# Patient Record
Sex: Female | Born: 1985 | Race: White | Hispanic: No | Marital: Single | Smoking: Current every day smoker
Health system: Southern US, Community
[De-identification: ages and names within clinical notes are randomized; demographics above are authoritative.]

## PROBLEM LIST (undated history)

## (undated) ENCOUNTER — Inpatient Hospital Stay (HOSPITAL_COMMUNITY): Payer: Self-pay

## (undated) DIAGNOSIS — K089 Disorder of teeth and supporting structures, unspecified: Secondary | ICD-10-CM

## (undated) DIAGNOSIS — R45851 Suicidal ideations: Secondary | ICD-10-CM

## (undated) DIAGNOSIS — F319 Bipolar disorder, unspecified: Secondary | ICD-10-CM

## (undated) HISTORY — PX: MANDIBLE FRACTURE SURGERY: SHX706

---

## 2005-10-21 ENCOUNTER — Inpatient Hospital Stay (HOSPITAL_COMMUNITY): Admission: AD | Admit: 2005-10-21 | Discharge: 2005-10-21 | Payer: Self-pay | Admitting: Family Medicine

## 2011-01-19 ENCOUNTER — Encounter: Payer: Self-pay | Admitting: Emergency Medicine

## 2011-01-19 ENCOUNTER — Emergency Department (HOSPITAL_COMMUNITY)
Admission: EM | Admit: 2011-01-19 | Discharge: 2011-01-19 | Disposition: A | Discharge status: Home / Self Care | Payer: Self-pay | Attending: Emergency Medicine | Admitting: Emergency Medicine

## 2011-01-19 DIAGNOSIS — K2970 Gastritis, unspecified, without bleeding: Secondary | ICD-10-CM | POA: Insufficient documentation

## 2011-01-19 DIAGNOSIS — K2990 Gastroduodenitis, unspecified, without bleeding: Secondary | ICD-10-CM | POA: Insufficient documentation

## 2011-01-19 DIAGNOSIS — Z79899 Other long term (current) drug therapy: Secondary | ICD-10-CM | POA: Insufficient documentation

## 2011-01-19 DIAGNOSIS — F3130 Bipolar disorder, current episode depressed, mild or moderate severity, unspecified: Secondary | ICD-10-CM | POA: Insufficient documentation

## 2011-01-19 DIAGNOSIS — K920 Hematemesis: Secondary | ICD-10-CM | POA: Insufficient documentation

## 2011-01-19 HISTORY — DX: Suicidal ideations: R45.851

## 2011-01-19 HISTORY — DX: Bipolar disorder, unspecified: F31.9

## 2011-01-19 LAB — URINALYSIS, ROUTINE W REFLEX MICROSCOPIC
Hgb urine dipstick: NEGATIVE
Nitrite: NEGATIVE
Protein, ur: NEGATIVE mg/dL
Specific Gravity, Urine: 1.039 — ABNORMAL HIGH (ref 1.005–1.030)
Urobilinogen, UA: 1 mg/dL (ref 0.0–1.0)

## 2011-01-19 LAB — PREGNANCY, URINE: Preg Test, Ur: NEGATIVE

## 2011-01-19 LAB — CBC
MCH: 32.5 pg (ref 26.0–34.0)
MCHC: 35.6 g/dL (ref 30.0–36.0)
Platelets: 246 10*3/uL (ref 150–400)
RDW: 12.8 % (ref 11.5–15.5)

## 2011-01-19 LAB — BASIC METABOLIC PANEL
Calcium: 8.2 mg/dL — ABNORMAL LOW (ref 8.4–10.5)
GFR calc Af Amer: >90 mL/min (ref >90)
GFR calc non Af Amer: >90 mL/min (ref >90)
Potassium: 3.6 mEq/L (ref 3.5–5.1)
Sodium: 136 mEq/L (ref 135–145)

## 2011-01-19 MED ORDER — ONDANSETRON HCL 4 MG/2ML IJ SOLN
4.0000 mg | Freq: Once | INTRAMUSCULAR | Status: AC
  Administered 2011-01-19: 4 mg via INTRAVENOUS
  Filled 2011-01-19: qty 2

## 2011-01-19 MED ORDER — SODIUM CHLORIDE 0.9 % IV BOLUS (SEPSIS)
1000.0000 mL | Freq: Once | INTRAVENOUS | Status: AC
  Administered 2011-01-19: 1000 mL via INTRAVENOUS

## 2011-01-19 MED ORDER — PROMETHAZINE HCL 25 MG PO TABS: 25.0000 mg | ORAL_TABLET | Freq: Four times a day (QID) | ORAL | Status: DC | PRN

## 2011-01-19 MED ORDER — PANTOPRAZOLE SODIUM 40 MG IV SOLR
40.0000 mg | Freq: Once | INTRAVENOUS | Status: AC
  Administered 2011-01-19: 40 mg via INTRAVENOUS
  Filled 2011-01-19: qty 40

## 2011-01-19 MED ORDER — SODIUM CHLORIDE 0.9 % IV SOLN
INTRAVENOUS | Status: DC
  Administered 2011-01-19: 22:00:00 via INTRAVENOUS

## 2011-01-19 MED ORDER — SODIUM CHLORIDE 0.9 % IV BOLUS (SEPSIS): 1000.0000 mL | Freq: Once | INTRAVENOUS | Status: DC

## 2011-01-19 MED ORDER — FAMOTIDINE 20 MG PO TABS: 20.0000 mg | ORAL_TABLET | Freq: Two times a day (BID) | ORAL | Status: DC

## 2011-01-19 NOTE — ED Notes (Signed)
Family at bedside. 

## 2011-01-19 NOTE — ED Notes (Signed)
Patient is resting comfortably. 

## 2011-01-19 NOTE — ED Provider Notes (Signed)
History     CSN: [id number] Arrival date & time: 01/19/2011  7:41 PM   First MD Initiated Contact with Patient 01/19/11 2112      Chief Complaint  Patient presents with  . Emesis    (Consider location/radiation/quality/duration/timing/severity/associated sxs/prior treatment) Patient is a 25 y.o. female presenting with vomiting. The history is provided by the patient.  Emesis  This is a recurrent problem. The current episode started more than 2 days ago (Started on Thursday 3 days ago.). The problem occurs 5 to 10 times per day. The problem has not changed since onset.The emesis has an appearance of stomach contents (Some specks of blood). There has been no fever. Pertinent negatives include no abdominal pain, no chills, no cough, no diarrhea, no fever, no headaches, no myalgias and no URI.    Patient with onset of vomiting 3 days ago no diarrhea some dark red specks of blood in the vomitus but no pool of blood, no significant abdominal pain, no diarrhea.  Past Medical History  Diagnosis Date  . Bipolar 1 disorder   . Manic depression   . Suicidal ideation     Past Surgical History  Procedure Date  . Mandible fracture surgery     No family history on file.  History  Substance Use Topics  . Smoking status: Current Everyday Smoker  . Smokeless tobacco: Not on file  . Alcohol Use: No    OB History    Grav Para Term Preterm Abortions TAB SAB Ect Mult Living                  Review of Systems  Constitutional: Negative for fever and chills.  HENT: Negative for congestion and neck pain.   Eyes: Negative for redness and visual disturbance.  Respiratory: Negative for cough and shortness of breath.   Cardiovascular: Negative for chest pain.  Gastrointestinal: Positive for nausea and vomiting. Negative for abdominal pain and diarrhea.  Genitourinary: Negative for dysuria and hematuria.  Musculoskeletal: Negative for myalgias and back pain.  Neurological: Negative for  syncope and headaches.  Hematological: Does not bruise/bleed easily.    Allergies  Codeine  Home Medications   Current Outpatient Rx  Name Route Sig Dispense Refill  . ACETAMINOPHEN 500 MG PO TABS Oral Take 1,000 mg by mouth every 6 (six) hours as needed. For pain     . BISMUTH SUBSALICYLATE 262 MG/15ML PO SUSP Oral Take 30 mLs by mouth every 6 (six) hours as needed. For upset stomach     . CITALOPRAM HYDROBROMIDE 20 MG PO TABS Oral Take 20 mg by mouth daily.      Marland Kitchen RISPERIDONE 2 MG PO TABS Oral Take 2 mg by mouth at bedtime.      . TRAZODONE HCL 150 MG PO TABS Oral Take 150 mg by mouth at bedtime.      Marland Kitchen FAMOTIDINE 20 MG PO TABS Oral Take 1 tablet (20 mg total) by mouth 2 (two) times daily. 30 tablet 0  . PROMETHAZINE HCL 25 MG PO TABS Oral Take 1 tablet (25 mg total) by mouth every 6 (six) hours as needed for nausea. 20 tablet 0    BP 103/51  Pulse 51  Temp(Src) 98 F (36.7 C) (Oral)  Resp 18  SpO2 100%  Physical Exam  Nursing note and vitals reviewed. Constitutional: She is oriented to person, place, and time. She appears well-developed and well-nourished.  HENT:  Head: Normocephalic and atraumatic.  Mouth/Throat: Oropharynx is clear and moist.  Mucous membranes slightly dry.  Eyes: Conjunctivae and EOM are normal. Pupils are equal, round, and reactive to light.  Neck: Normal range of motion. Neck supple.  Cardiovascular: Normal rate, regular rhythm, normal heart sounds and intact distal pulses.   No murmur heard. Pulmonary/Chest: Effort normal and breath sounds normal. She exhibits no tenderness.  Abdominal: Soft. Bowel sounds are normal. There is no tenderness.  Musculoskeletal: Normal range of motion. She exhibits no edema.  Lymphadenopathy:    She has no cervical adenopathy.  Neurological: She is alert and oriented to person, place, and time. No cranial nerve deficit. She exhibits normal muscle tone. Coordination normal.  Skin: Skin is warm and dry. No rash  noted. She is not diaphoretic. No erythema.    ED Course  Procedures (including critical care time)  Labs Reviewed  BASIC METABOLIC PANEL - Abnormal; Notable for the following:    Calcium 8.2 (*)    All other components within normal limits  URINALYSIS, ROUTINE W REFLEX MICROSCOPIC - Abnormal; Notable for the following:    Specific Gravity, Urine 1.039 (*)    Bilirubin Urine SMALL (*)    Ketones, ur >80 (*)    All other components within normal limits  CBC  PREGNANCY, URINE   Results for orders placed during the hospital encounter of 01/19/11  CBC      Component Value Range   WBC 7.5  4.0 - 10.5 (K/uL)   RBC 4.58  3.87 - 5.11 (MIL/uL)   Hemoglobin 14.9  12.0 - 15.0 (g/dL)   HCT 16.1  09.6 - 04.5 (%)   MCV 91.3  78.0 - 100.0 (fL)   MCH 32.5  26.0 - 34.0 (pg)   MCHC 35.6  30.0 - 36.0 (g/dL)   RDW 40.9  81.1 - 91.4 (%)   Platelets 246  150 - 400 (K/uL)  BASIC METABOLIC PANEL      Component Value Range   Sodium 136  135 - 145 (mEq/L)   Potassium 3.6  3.5 - 5.1 (mEq/L)   Chloride 101  96 - 112 (mEq/L)   CO2 20  19 - 32 (mEq/L)   Glucose, Bld 71  70 - 99 (mg/dL)   BUN 16  6 - 23 (mg/dL)   Creatinine, Ser 7.82  0.50 - 1.10 (mg/dL)   Calcium 8.2 (*) 8.4 - 10.5 (mg/dL)   GFR calc non Af Amer >90  >90 (mL/min)   GFR calc Af Amer >90  >90 (mL/min)  URINALYSIS, ROUTINE W REFLEX MICROSCOPIC      Component Value Range   Color, Urine YELLOW  YELLOW    APPearance CLEAR  CLEAR    Specific Gravity, Urine 1.039 (*) 1.005 - 1.030    pH 5.5  5.0 - 8.0    Glucose, UA NEGATIVE  NEGATIVE (mg/dL)   Hgb urine dipstick NEGATIVE  NEGATIVE    Bilirubin Urine SMALL (*) NEGATIVE    Ketones, ur >80 (*) NEGATIVE (mg/dL)   Protein, ur NEGATIVE  NEGATIVE (mg/dL)   Urobilinogen, UA 1.0  0.0 - 1.0 (mg/dL)   Nitrite NEGATIVE  NEGATIVE    Leukocytes, UA NEGATIVE  NEGATIVE   PREGNANCY, URINE      Component Value Range   Preg Test, Ur NEGATIVE       1. Gastritis   2. Hematemesis       MDM     Patient with persistent vomiting for several days some specks of blood in the vomit never any pool of blood. Labs are  normal in the emergency department, pregnancy test negative, improved with 2 L of fluid IV. Feels much better. Suspect gastritis may be viral no significant upper GI bleed. Will treat with Pepcid and Phenergan she will return to the emergency department for vomiting pool of blood or worse symptoms.        Shelda Jakes, MD 01/19/11 [phone]

## 2011-01-19 NOTE — ED Notes (Signed)
C/o nausea, vomiting, fever, and dizziness since Thursday.  States she is now vomiting dark colored blood.

## 2011-01-22 ENCOUNTER — Encounter (HOSPITAL_COMMUNITY): Payer: Self-pay | Admitting: Emergency Medicine

## 2011-01-22 ENCOUNTER — Emergency Department (HOSPITAL_COMMUNITY)
Admission: EM | Admit: 2011-01-22 | Discharge: 2011-01-22 | Discharge status: Against Medical Advice | Payer: Self-pay | Attending: Emergency Medicine | Admitting: Emergency Medicine

## 2011-01-22 DIAGNOSIS — L298 Other pruritus: Secondary | ICD-10-CM | POA: Insufficient documentation

## 2011-01-22 DIAGNOSIS — R21 Rash and other nonspecific skin eruption: Secondary | ICD-10-CM | POA: Insufficient documentation

## 2011-01-22 DIAGNOSIS — L2989 Other pruritus: Secondary | ICD-10-CM | POA: Insufficient documentation

## 2011-01-22 NOTE — ED Notes (Signed)
Pt did not answer x 2 

## 2011-01-22 NOTE — ED Notes (Signed)
Pt c/o rash all over body with itching onset 2 days ago.

## 2011-02-18 NOTE — L&D Delivery Note (Signed)
Delivery Note At 6:05 AM a viable and healthy female was delivered via Vaginal, Spontaneous Delivery (Presentation: Right Occiput Posterior).  APGAR: 8, 9; weight .   Placenta status: Intact, Spontaneous.  Cord: 3 vessels with the following complications: None.  Cord pH: n/a  Anesthesia: None  Episiotomy: None Lacerations: None Suture Repair: n/a Est. Blood Loss (mL): 250  Mom to postpartum.  Baby to nursery-stable.   [name] 11/12/2011, 6:18 AM

## 2011-02-18 NOTE — L&D Delivery Note (Signed)
I have seen and examined this patient and I agree with the above. Cam Hai 11:35 PM 11/12/2011

## 2011-09-27 ENCOUNTER — Inpatient Hospital Stay (HOSPITAL_COMMUNITY)
Admission: AD | Admit: 2011-09-27 | Discharge: 2011-09-28 | Disposition: A | Discharge status: Home / Self Care | Payer: Medicaid Other | Source: Ambulatory Visit | Attending: Obstetrics & Gynecology | Admitting: Obstetrics & Gynecology

## 2011-09-27 ENCOUNTER — Inpatient Hospital Stay (HOSPITAL_COMMUNITY): Payer: Medicaid Other

## 2011-09-27 ENCOUNTER — Encounter (HOSPITAL_COMMUNITY): Payer: Self-pay | Admitting: *Deleted

## 2011-09-27 DIAGNOSIS — A499 Bacterial infection, unspecified: Secondary | ICD-10-CM | POA: Insufficient documentation

## 2011-09-27 DIAGNOSIS — N390 Urinary tract infection, site not specified: Secondary | ICD-10-CM | POA: Insufficient documentation

## 2011-09-27 DIAGNOSIS — O2390 Unspecified genitourinary tract infection in pregnancy, unspecified trimester: Secondary | ICD-10-CM | POA: Insufficient documentation

## 2011-09-27 DIAGNOSIS — O0930 Supervision of pregnancy with insufficient antenatal care, unspecified trimester: Secondary | ICD-10-CM

## 2011-09-27 DIAGNOSIS — O2340 Unspecified infection of urinary tract in pregnancy, unspecified trimester: Secondary | ICD-10-CM

## 2011-09-27 DIAGNOSIS — R079 Chest pain, unspecified: Secondary | ICD-10-CM | POA: Insufficient documentation

## 2011-09-27 DIAGNOSIS — N760 Acute vaginitis: Secondary | ICD-10-CM | POA: Insufficient documentation

## 2011-09-27 DIAGNOSIS — R109 Unspecified abdominal pain: Secondary | ICD-10-CM | POA: Insufficient documentation

## 2011-09-27 DIAGNOSIS — B9689 Other specified bacterial agents as the cause of diseases classified elsewhere: Secondary | ICD-10-CM | POA: Insufficient documentation

## 2011-09-27 HISTORY — DX: Disorder of teeth and supporting structures, unspecified: K08.9

## 2011-09-27 LAB — WET PREP, GENITAL: Trich, Wet Prep: NONE SEEN

## 2011-09-27 LAB — URINALYSIS, ROUTINE W REFLEX MICROSCOPIC
Bilirubin Urine: NEGATIVE
Ketones, ur: 15 mg/dL — AB
Nitrite: POSITIVE — AB
Protein, ur: NEGATIVE mg/dL
pH: 6.5 (ref 5.0–8.0)

## 2011-09-27 LAB — RAPID URINE DRUG SCREEN, HOSP PERFORMED
Barbiturates: NOT DETECTED
Benzodiazepines: NOT DETECTED
Cocaine: NOT DETECTED
Opiates: NOT DETECTED
Tetrahydrocannabinol: NOT DETECTED

## 2011-09-27 LAB — CBC
HCT: 33.2 % — ABNORMAL LOW (ref 36.0–46.0)
Hemoglobin: 11.3 g/dL — ABNORMAL LOW (ref 12.0–15.0)
WBC: 12.4 10*3/uL — ABNORMAL HIGH (ref 4.0–10.5)

## 2011-09-27 LAB — DIFFERENTIAL
Lymphocytes Relative: 28 % (ref 12–46)
Monocytes Absolute: 1 10*3/uL (ref 0.1–1.0)
Monocytes Relative: 8 % (ref 3–12)
Neutro Abs: 7.9 10*3/uL — ABNORMAL HIGH (ref 1.7–7.7)

## 2011-09-27 LAB — URINE MICROSCOPIC-ADD ON

## 2011-09-27 MED ORDER — RANITIDINE HCL 150 MG PO TABS: 150.0000 mg | ORAL_TABLET | Freq: Two times a day (BID) | ORAL | Status: DC

## 2011-09-27 MED ORDER — METRONIDAZOLE 500 MG PO TABS: 500.0000 mg | ORAL_TABLET | Freq: Two times a day (BID) | ORAL | Status: AC

## 2011-09-27 MED ORDER — CEPHALEXIN 500 MG PO CAPS: 500.0000 mg | ORAL_CAPSULE | Freq: Two times a day (BID) | ORAL | Status: AC

## 2011-09-27 MED ORDER — GI COCKTAIL ~~LOC~~: 30.0000 mL | Freq: Once | ORAL | Status: DC

## 2011-09-27 MED ORDER — GI COCKTAIL ~~LOC~~
30.0000 mL | Freq: Once | ORAL | Status: AC
  Administered 2011-09-27: 30 mL via ORAL
  Filled 2011-09-27: qty 30

## 2011-09-27 NOTE — MAU Note (Signed)
Pt states, " I started having pain from my chest to my upper legs since Wed, and it has gotten worse today."
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
## 2011-09-27 NOTE — Discharge Instructions (Signed)
Bacterial Vaginosis Bacterial vaginosis (BV) is a vaginal infection where the normal balance of bacteria in the vagina is disrupted. The normal balance is then replaced by an overgrowth of certain bacteria. There are several different kinds of bacteria that can cause BV. BV is the most common vaginal infection in women of childbearing age. CAUSES   The cause of BV is not fully understood. BV develops when there is an increase or imbalance of harmful bacteria.   Some activities or behaviors can upset the normal balance of bacteria in the vagina and put women at increased risk including:   Having a new sex partner or multiple sex partners.   Douching.   Using an intrauterine device (IUD) for contraception.   It is not clear what role sexual activity plays in the development of BV. However, women that have never had sexual intercourse are rarely infected with BV.  Women do not get BV from toilet seats, bedding, swimming pools or from touching objects around them.  SYMPTOMS   Grey vaginal discharge.   A fish-like odor with discharge, especially after sexual intercourse.   Itching or burning of the vagina and vulva.   Burning or pain with urination.   Some women have no signs or symptoms at all.  DIAGNOSIS  Your caregiver must examine the vagina for signs of BV. Your caregiver will perform lab tests and look at the sample of vaginal fluid through a microscope. They will look for bacteria and abnormal cells (clue cells), a pH test higher than 4.5, and a positive amine test all associated with BV.  RISKS AND COMPLICATIONS   Pelvic inflammatory disease (PID).   Infections following gynecology surgery.   Developing HIV.   Developing herpes virus.  TREATMENT  Sometimes BV will clear up without treatment. However, all women with symptoms of BV should be treated to avoid complications, especially if gynecology surgery is planned. Female partners generally do not need to be treated. However,  BV may spread between female sex partners so treatment is helpful in preventing a recurrence of BV.   BV may be treated with antibiotics. The antibiotics come in either pill or vaginal cream forms. Either can be used with nonpregnant or pregnant women, but the recommended dosages differ. These antibiotics are not harmful to the baby.   BV can recur after treatment. If this happens, a second round of antibiotics will often be prescribed.   Treatment is important for pregnant women. If not treated, BV can cause a premature delivery, especially for a pregnant woman who had a premature birth in the past. All pregnant women who have symptoms of BV should be checked and treated.   For chronic reoccurrence of BV, treatment with a type of prescribed gel vaginally twice a week is helpful.  HOME CARE INSTRUCTIONS   Finish all medication as directed by your caregiver.   Do not have sex until treatment is completed.   Tell your sexual partner that you have a vaginal infection. They should see their caregiver and be treated if they have problems, such as a mild rash or itching.   Practice safe sex. Use condoms. Only have 1 sex partner.  PREVENTION  Basic prevention steps can help reduce the risk of upsetting the natural balance of bacteria in the vagina and developing BV:  Do not have sexual intercourse (be abstinent).   Do not douche.   Use all of the medicine prescribed for treatment of BV, even if the signs and symptoms go away.  Tell your sex partner if you have BV. That way, they can be treated, if needed, to prevent reoccurrence.  SEEK MEDICAL CARE IF:   Your symptoms are not improving after 3 days of treatment.   You have increased discharge, pain, or fever.  MAKE SURE YOU:   Understand these instructions.   Will watch your condition.   Will get help right away if you are not doing well or get worse.  FOR MORE INFORMATION  Division of STD Prevention (DSTDP), Centers for Disease  Control and Prevention: SolutionApps.co.za American Social Health Association (ASHA): www.ashastd.org  Document Released: 02/03/2005 Document Revised: 01/23/2011 Document Reviewed: 07/27/2008 [facility] Patient Information 2012 [place] [place], [place].Urinary Tract Infection in Pregnancy A urinary tract infection (UTI) is a bacterial infection of the urinary tract. Infection of the urinary tract can include the ureters, kidneys (pyelonephritis), bladder (cystitis), and urethra (urethritis). All pregnant women should be screened for bacteria in the urinary tract. Identifying and treating a UTI will decrease the risk of preterm labor and developing more serious infections in both the mother and baby. CAUSES Bacteria germs cause almost all UTIs. There are many factors that can increase your chances of getting a UTI during pregnancy. These include:  Having a short urethra.   Poor toilet and hygiene habits.   Sexual intercourse.   Blockage of urine along the urinary tract.   Problems with the pelvic muscles or nerves.   Diabetes.   Obesity.   Bladder problems after having several children.   Previous history of UTI.  SYMPTOMS   Pain, burning, or a stinging feeling when urinating.   Suddenly feeling the need to urinate right away (urgency).   Loss of bladder control (urinary incontinence).   Frequent urination, more than is common with pregnancy.   Lower abdominal or back discomfort.   Bad smelling urine.   Cloudy urine.   Blood in the urine (hematuria).   Fever.  When the kidneys are infected, the symptoms may be:  Back pain.   Flank pain on the right side more so than the left.   Fever.   Chills.   Nausea.   Vomiting.  DIAGNOSIS   Urine tests.   Additional tests and procedures may include:   Ultrasound of the kidneys, ureters, bladder, and urethra.   Looking in the bladder with a lighted tube (cystoscopy).   Certain X-ray studies only when absolutely necessary.   Finding out the results of your test Ask when your test results will be ready. Make sure you get your test results. TREATMENT  Antibiotic medicine by mouth.   Antibiotics given through the vein (intravenously), if needed.  HOME CARE INSTRUCTIONS   Take your antibiotics as directed. Finish them even if you start to feel better. Only take medicine as directed by your caregiver.   Drink enough fluids to keep your urine clear or pale yellow.   Do not have sexual intercourse until the infection is gone and your caregiver says it is okay.   Make sure you are tested for UTIs throughout your pregnancy if you get one. These infections often come back.  Preventing a UTI in the future:  Practice good toilet habits. Always wipe from front to back. Use the tissue only once.   Do not hold your urine. Empty your bladder as soon as possible when the urge comes.   Do not douche or use deodorant sprays.   Wash with soap and warm water around the genital area and the anus.   Empty  your bladder before and after sexual intercourse.   Wear underwear with a cotton crotch.   Avoid caffeine and carbonated drinks. They can irritate the bladder.   Drink cranberry juice or take cranberry pills. This may decrease the risk of getting a UTI.   Do not drink alcohol.   Keep all your appointments and tests as scheduled.  SEEK MEDICAL CARE IF:   Your symptoms get worse.   You are still having fevers 2 or more days after treatment begins.   You develop a rash.   You feel that you are having problems with medicines prescribed.   You develop abnormal vaginal discharge.  SEEK IMMEDIATE MEDICAL CARE IF:   You develop back or flank pain.   You develop chills.   You have blood in your urine.   You develop nausea and vomiting.   You develop contractions of your uterus.   You have a gush of fluid from the vagina.  MAKE SURE YOU:   Understand these instructions.   Will watch your condition.    Will get help right away if you are not doing well or get worse.  Document Released: 05/31/2010 Document Revised: 01/23/2011 Document Reviewed: 05/31/2010 [facility] Patient Information 2012 [place], [place].

## 2011-09-27 NOTE — Progress Notes (Signed)
To us via wc.

## 2011-09-27 NOTE — MAU Provider Note (Signed)
History      CSN: [id number]  Arrival date and time: 09/27/11 2012   None     Chief Complaint  Patient presents with  . Abdominal Pain  . Chest Pain  . Vaginal Discharge   HPI Pt is a 26 y.o. [id number] at 33.2 wks by LMP presenting with pain in lower abdomen and chest. Abdominal pain extends from lower quadrants to mid-thigh and is stabbing in nature, does not think they are contractions. Also has vaginal discharge, mostly mucous but some odor. Pain in chest is stabbing, lower sternum/epigastric area. All symptoms started approximately 4 days ago. Also states she has had fever. Taking tylenol. Pain better with sitting, walking, changing position. Nothing really makes it worse. No dysuria or urgency.   Pt had initial prenatal visit in March at [facility]. and told EDD 11/13/11 based on LMP.  Moved to [place] and had a few visits there including an ultrasound which she states was normal. EDD by this sono was 11/24/11 (done at about 22 wks.).  States LMP was 01/27/11 and that she has regular menses.  No loss of fluid. No VB. Good FM, no discreet contractions.  OB History    Grav Para Term Preterm Abortions TAB SAB Ect Mult Living   6 2 2  4  4   2       Past Medical History  Diagnosis Date  . Bipolar 1 disorder   . Manic depression   . Suicidal ideation   . Poor dentition     Past Surgical History  Procedure Date  . Mandible fracture surgery     Family History  Problem Relation Age of Onset  . Other Neg Hx     History  Substance Use Topics  . Smoking status: Current Everyday Smoker  . Smokeless tobacco: Not on file  . Alcohol Use: No    Allergies:  Allergies  Allergen Reactions  . Codeine Nausea And Vomiting    Prescriptions prior to admission  Medication Sig Dispense Refill  . acetaminophen (TYLENOL) 500 MG tablet Take 500 mg by mouth every 6 (six) hours as needed. For pain      . Prenatal Vit-Fe Fumarate-FA (PRENATAL MULTIVITAMIN) TABS Take 1  tablet by mouth daily.        Review of Systems  Constitutional: Positive for fever. Negative for chills.  Eyes: Negative for blurred vision and double vision.  Respiratory: Negative for cough, sputum production and shortness of breath.   Cardiovascular: Positive for chest pain (see HPI).  Gastrointestinal: Positive for nausea, vomiting (Occasional vomiting, sometimes with a little blood) and constipation. Negative for heartburn and diarrhea.  Genitourinary: Negative for dysuria and urgency.  Neurological: Negative for dizziness and headaches.  Psychiatric/Behavioral:       Denies current symptoms of depression/suicidality/mania   Physical Exam   Blood pressure 124/61, pulse 79, temperature 97.9 F (36.6 C), temperature source Oral, resp. rate 20, height 5\' 1"  (1.549 m), weight 62.256 kg (137 lb 4 oz), unknown if currently breastfeeding.  Physical Exam  Constitutional: She is oriented to person, place, and time. She appears well-developed and well-nourished. No distress.  HENT:  Head: Normocephalic.       Poor dentition  Neck: Normal range of motion.  Cardiovascular: Normal rate, regular rhythm and normal heart sounds.   Respiratory: Effort normal and breath sounds normal. No respiratory distress. She exhibits tenderness (Mild tenderness lower sternum).  GI: Soft. Bowel sounds are normal. She exhibits no distension. There is  tenderness (Left and right lower quadrants and suprapubic. Mild epigastric tenderness).  Genitourinary: Vaginal discharge (mucous and milky green/gray discharge) found.  Musculoskeletal: She exhibits no edema.  Neurological: She is alert and oriented to person, place, and time.  Skin: Skin is warm and dry.  Psychiatric: She has a normal mood and affect.   Dilation: Closed/thick/high Exam by:: Dr Thad Ranger  [facility]:  130, moderate variability, +accels, no decels. Cat I. Reactive NST Toco:  Few irregular contractions after cervical exam.  MAU Course   Procedures  MDM  Assessment and Plan  25 y.o. [id number] at [redacted]w[redacted]d by LMP with 1.  UTI - cephalexin 2. BV - metronidazole.  3.  Lower bdominal pain - Cervix closed, not contracting regularly. Likely due to 1 & 2. 4. Insufficient prenatal care. Prenatal labs and ultrasound done today. Establish care at [facility]. 5.  Chest pain - musculockeletal (reproducible), possibly related to reflux. GI cocktail given in MAU. Somewhat improved. Will send home on ranitidine.     Napoleon Form 09/27/2011, 9:14 PM

## 2011-09-27 NOTE — MAU Note (Signed)
OK to leave pt off efm when returns from u/s per W. Muhammed CNM

## 2011-09-28 DIAGNOSIS — N390 Urinary tract infection, site not specified: Secondary | ICD-10-CM

## 2011-09-28 DIAGNOSIS — O2390 Unspecified genitourinary tract infection in pregnancy, unspecified trimester: Secondary | ICD-10-CM

## 2011-09-28 LAB — RPR: RPR Ser Ql: NONREACTIVE

## 2011-09-28 MED ORDER — ONDANSETRON HCL 4 MG PO TABS: 4.0000 mg | ORAL_TABLET | Freq: Three times a day (TID) | ORAL | Status: AC | PRN

## 2011-09-28 NOTE — Progress Notes (Signed)
Written and verbal d/c instructions given and understanding voiced. 

## 2011-09-29 ENCOUNTER — Encounter: Payer: Self-pay | Admitting: Family

## 2011-09-29 ENCOUNTER — Encounter: Payer: Self-pay | Admitting: *Deleted

## 2011-09-29 LAB — GC/CHLAMYDIA PROBE AMP, GENITAL: Chlamydia, DNA Probe: NEGATIVE

## 2011-10-08 ENCOUNTER — Encounter: Payer: Self-pay | Admitting: Physician Assistant

## 2011-10-27 ENCOUNTER — Encounter: Payer: Self-pay | Admitting: Obstetrics & Gynecology

## 2011-10-30 ENCOUNTER — Ambulatory Visit (INDEPENDENT_AMBULATORY_CARE_PROVIDER_SITE_OTHER): Payer: Self-pay | Admitting: Obstetrics & Gynecology

## 2011-10-30 ENCOUNTER — Encounter: Payer: Self-pay | Admitting: Obstetrics & Gynecology

## 2011-10-30 VITALS — BP 127/82 | Temp 97.0°F | Wt 132.1 lb

## 2011-10-30 DIAGNOSIS — Z23 Encounter for immunization: Secondary | ICD-10-CM

## 2011-10-30 DIAGNOSIS — O2390 Unspecified genitourinary tract infection in pregnancy, unspecified trimester: Secondary | ICD-10-CM

## 2011-10-30 DIAGNOSIS — N390 Urinary tract infection, site not specified: Secondary | ICD-10-CM

## 2011-10-30 DIAGNOSIS — Z3490 Encounter for supervision of normal pregnancy, unspecified, unspecified trimester: Secondary | ICD-10-CM

## 2011-10-30 DIAGNOSIS — O0930 Supervision of pregnancy with insufficient antenatal care, unspecified trimester: Secondary | ICD-10-CM

## 2011-10-30 DIAGNOSIS — O2340 Unspecified infection of urinary tract in pregnancy, unspecified trimester: Secondary | ICD-10-CM | POA: Insufficient documentation

## 2011-10-30 LAB — POCT URINALYSIS DIP (DEVICE)
Ketones, ur: NEGATIVE mg/dL
Protein, ur: 30 mg/dL — AB
Specific Gravity, Urine: 1.015 (ref 1.005–1.030)
Urobilinogen, UA: 0.2 mg/dL (ref 0.0–1.0)
pH: 7 (ref 5.0–8.0)

## 2011-10-30 MED ORDER — TETANUS-DIPHTH-ACELL PERTUSSIS 5-2.5-18.5 LF-MCG/0.5 IM SUSP: 0.5000 mL | Freq: Once | INTRAMUSCULAR | Status: DC

## 2011-10-30 MED ORDER — CEPHALEXIN 500 MG PO CAPS: 500.0000 mg | ORAL_CAPSULE | Freq: Three times a day (TID) | ORAL | Status: AC

## 2011-10-30 NOTE — Progress Notes (Signed)
   Subjective:    Lisa Jones is a [id number] [redacted]w[redacted]d being seen today for her first obstetrical visit.  Her obstetrical history is significant for late prenatal care. Patient does intend to breast feed. Pregnancy history fully reviewed.  Patient reports no complaints.  Filed Vitals:   10/30/11 0930  BP: 127/82  Temp: 97 F (36.1 C)  Weight: 132 lb 1.6 oz (59.92 kg)    HISTORY: OB History    Grav Para Term Preterm Abortions TAB SAB Ect Mult Living   7 2 2  4  4   2      # Outc Date GA Lbr Len/2nd Wgt Sex Del Anes PTL Lv   1 SAB 2004 [redacted]w[redacted]d          2 SAB 2004 [redacted]w[redacted]d          3 SAB 2005 [redacted]w[redacted]d          4 SAB 2007 [redacted]w[redacted]d          Comments: had D&C   5 TRM 4/08 [redacted]w[redacted]d  5lb15oz(2.693kg) F SVD   Yes   6 TRM 9/12 [redacted]w[redacted]d  5lb8oz(2.495kg) F SVD   Yes   7 CUR              Past Medical History  Diagnosis Date  . Suicidal ideation   . Poor dentition   . Bipolar 1 disorder   . Manic depression    Past Surgical History  Procedure Date  . Mandible fracture surgery    Family History  Problem Relation Age of Onset  . Other Neg Hx   . Heart disease Father      Exam    Uterus:     Pelvic Exam:    Perineum: No Hemorrhoids   Vulva: normal   Vagina:  thin grey discharge   pH:    Cervix: no lesions   Adnexa: no mass, fullness, tenderness   Bony Pelvis: gynecoid  System: Breast:     Skin: normal coloration and turgor, no rashes    Neurologic: oriented, grossly non-focal, normal mood   Extremities: normal strength, tone, and muscle mass   HEENT    Mouth/Teeth mucous membranes moist, pharynx normal without lesions, dental hygiene poor and multiple caries   Neck supple and no masses   Cardiovascular:    Respiratory:  appears well, vitals normal, no respiratory distress, acyanotic, normal RR   Abdomen: gravid 35 cm FH   Urinary: urethral meatus normal      Assessment:    Pregnancy: [id number] Patient Active Problem List  Diagnosis  . Supervision of normal pregnancy  . UTI  (urinary tract infection) during pregnancy        Plan:     Initial labs drawn. Prenatal vitamins. Problem list reviewed and updated.   Ultrasound discussed; fetal survey: results reviewed.  Follow up in 1 weeks. 50% of 30 min visit spent on counseling and coordination of care.  Social work Cephalexin rx   Lisa Jones 10/30/2011

## 2011-10-30 NOTE — Addendum Note (Signed)
Addended byRaynald Blend L on: 10/30/2011 11:05 AM   Modules accepted: Orders

## 2011-10-30 NOTE — Progress Notes (Signed)
P=68 Patient needs prenatal vitamin prescription of one that is more easily tolerated- states the over the counter ones make her sick. C/o trace edema in feet. Has had alittle prenatal care in [place]. C/o pelvic pain. C/o contractions past 3 days about 6-7 times a day. Per depression screen states depressed sometimes, her daughters are in [place]  With family. Denies thoughts of hurting self. Happy baby is boy.

## 2011-10-30 NOTE — Addendum Note (Signed)
Addended by: Franchot Mimes on: 10/30/2011 11:00 AM   Modules accepted: Orders

## 2011-10-30 NOTE — Patient Instructions (Signed)
Normal Labor and Delivery Your caregiver must first be sure you are in labor. Signs of labor include:  You may pass what is called "the mucus plug" before labor begins. This is a small amount of blood stained mucus.   Regular uterine contractions.   The time between contractions get closer together.   The discomfort and pain gradually gets more intense.   Pains are mostly located in the back.   Pains get worse when walking.   The cervix (the opening of the uterus becomes thinner (begins to efface) and opens up (dilates).  Once you are in labor and admitted into the hospital or care center, your caregiver will do the following:  A complete physical examination.   Check your vital signs (blood pressure, pulse, temperature and the fetal heart rate).   Do a vaginal examination (using a sterile glove and lubricant) to determine:   The position (presentation) of the baby (head [vertex] or buttock first).   The level (station) of the baby's head in the birth canal.   The effacement and dilatation of the cervix.   You may have your pubic hair shaved and be given an enema depending on your caregiver and the circumstance.   An electronic monitor is usually placed on your abdomen. The monitor follows the length and intensity of the contractions, as well as the baby's heart rate.   Usually, your caregiver will insert an IV in your arm with a bottle of sugar water. This is done as a precaution so that medications can be given to you quickly during labor or delivery.  NORMAL LABOR AND DELIVERY IS DIVIDED UP INTO 3 STAGES: First Stage This is when regular contractions begin and the cervix begins to efface and dilate. This stage can last from 3 to 15 hours. The end of the first stage is when the cervix is 100% effaced and 10 centimeters dilated. Pain medications may be given by   Injection (morphine, demerol, etc.)   Regional anesthesia (spinal, caudal or epidural, anesthetics given in  different locations of the spine). Paracervical pain medication may be given, which is an injection of and anesthetic on each side of the cervix.  A pregnant woman may request to have "Natural Childbirth" which is not to have any medications or anesthesia during her labor and delivery. Second Stage This is when the baby comes down through the birth canal (vagina) and is born. This can take 1 to 4 hours. As the baby's head comes down through the birth canal, you may feel like you are going to have a bowel movement. You will get the urge to bear down and push until the baby is delivered. As the baby's head is being delivered, the caregiver will decide if an episiotomy (a cut in the perineum and vagina area) is needed to prevent tearing of the tissue in this area. The episiotomy is sewn up after the delivery of the baby and placenta. Sometimes a mask with nitrous oxide is given for the mother to breath during the delivery of the baby to help if there is too much pain. The end of Stage 2 is when the baby is fully delivered. Then when the umbilical cord stops pulsating it is clamped and cut. Third Stage The third stage begins after the baby is completely delivered and ends after the placenta (afterbirth) is delivered. This usually takes 5 to 30 minutes. After the placenta is delivered, a medication is given either by intravenous or injection to help contract   the uterus and prevent bleeding. The third stage is not painful and pain medication is usually not necessary. If an episiotomy was done, it is repaired at this time. After the delivery, the mother is watched and monitored closely for 1 to 2 hours to make sure there is no postpartum bleeding (hemorrhage). If there is a lot of bleeding, medication is given to contract the uterus and stop the bleeding. Document Released: 11/13/2007 Document Revised: 01/23/2011 Document Reviewed: 11/13/2007 ExitCare Patient Information 2012 [employer]. 

## 2011-10-31 LAB — GC/CHLAMYDIA PROBE AMP, GENITAL
Chlamydia, DNA Probe: NEGATIVE
GC Probe Amp, Genital: NEGATIVE

## 2011-11-02 LAB — CULTURE, BETA STREP (GROUP B ONLY)

## 2011-11-05 ENCOUNTER — Ambulatory Visit (INDEPENDENT_AMBULATORY_CARE_PROVIDER_SITE_OTHER): Payer: Self-pay | Admitting: Advanced Practice Midwife

## 2011-11-05 VITALS — BP 132/88 | Temp 97.1°F | Wt 133.8 lb

## 2011-11-05 DIAGNOSIS — O2390 Unspecified genitourinary tract infection in pregnancy, unspecified trimester: Secondary | ICD-10-CM

## 2011-11-05 DIAGNOSIS — N390 Urinary tract infection, site not specified: Secondary | ICD-10-CM

## 2011-11-05 DIAGNOSIS — O2340 Unspecified infection of urinary tract in pregnancy, unspecified trimester: Secondary | ICD-10-CM

## 2011-11-05 LAB — POCT URINALYSIS DIP (DEVICE)
Glucose, UA: NEGATIVE mg/dL
Nitrite: POSITIVE — AB
Urobilinogen, UA: 2 mg/dL — ABNORMAL HIGH (ref 0.0–1.0)

## 2011-11-05 NOTE — Progress Notes (Signed)
Pulse- 80 

## 2011-11-05 NOTE — Progress Notes (Signed)
Doing well, reports good fetal movement, denies h/a, epigastric pain, visual disturbances.  Denies urinary symptoms.  Some vaginal discharge, no itching/burning/odor.  Nitrite positive on dip today, continue Keflex, has 2-3 more days of treatment and will retest next week.

## 2011-11-07 ENCOUNTER — Encounter: Payer: Self-pay | Admitting: *Deleted

## 2011-11-12 ENCOUNTER — Encounter: Payer: Self-pay | Admitting: Advanced Practice Midwife

## 2011-11-12 ENCOUNTER — Encounter (HOSPITAL_COMMUNITY): Payer: Self-pay | Admitting: *Deleted

## 2011-11-12 ENCOUNTER — Inpatient Hospital Stay (HOSPITAL_COMMUNITY)
Admission: AD | Admit: 2011-11-12 | Discharge: 2011-11-13 | DRG: 775 | Disposition: A | Discharge status: Home / Self Care | Payer: Medicaid Other | Source: Ambulatory Visit | Attending: Obstetrics and Gynecology | Admitting: Obstetrics and Gynecology

## 2011-11-12 DIAGNOSIS — O2340 Unspecified infection of urinary tract in pregnancy, unspecified trimester: Secondary | ICD-10-CM

## 2011-11-12 DIAGNOSIS — O99334 Smoking (tobacco) complicating childbirth: Principal | ICD-10-CM | POA: Diagnosis present

## 2011-11-12 DIAGNOSIS — O0930 Supervision of pregnancy with insufficient antenatal care, unspecified trimester: Secondary | ICD-10-CM

## 2011-11-12 LAB — CBC
Platelets: 529 10*3/uL — ABNORMAL HIGH (ref 150–400)
RDW: 13.8 % (ref 11.5–15.5)
WBC: 19.4 10*3/uL — ABNORMAL HIGH (ref 4.0–10.5)

## 2011-11-12 MED ORDER — DIBUCAINE 1 % RE OINT: 1.0000 "application " | TOPICAL_OINTMENT | RECTAL | Status: DC | PRN

## 2011-11-12 MED ORDER — ACETAMINOPHEN 325 MG PO TABS: 650.0000 mg | ORAL_TABLET | ORAL | Status: DC | PRN

## 2011-11-12 MED ORDER — PRENATAL MULTIVITAMIN CH
1.0000 | ORAL_TABLET | Freq: Every day | ORAL | Status: DC
  Administered 2011-11-13: 1 via ORAL
  Filled 2011-11-12 (×2): qty 1

## 2011-11-12 MED ORDER — LIDOCAINE HCL (PF) 1 % IJ SOLN
30.0000 mL | INTRAMUSCULAR | Status: DC | PRN
  Filled 2011-11-12: qty 30

## 2011-11-12 MED ORDER — OXYTOCIN 40 UNITS IN LACTATED RINGERS INFUSION - SIMPLE MED
62.5000 mL/h | Freq: Once | INTRAVENOUS | Status: DC
  Filled 2011-11-12: qty 1000

## 2011-11-12 MED ORDER — FENTANYL CITRATE 0.05 MG/ML IJ SOLN
100.0000 ug | INTRAMUSCULAR | Status: DC | PRN
  Filled 2011-11-12: qty 2

## 2011-11-12 MED ORDER — OXYCODONE-ACETAMINOPHEN 5-325 MG PO TABS
1.0000 | ORAL_TABLET | ORAL | Status: DC | PRN
  Administered 2011-11-12 – 2011-11-13 (×3): 1 via ORAL
  Filled 2011-11-12 (×3): qty 1

## 2011-11-12 MED ORDER — INFLUENZA VIRUS VACC SPLIT PF IM SUSP
0.5000 mL | INTRAMUSCULAR | Status: AC
  Administered 2011-11-13: 0.5 mL via INTRAMUSCULAR

## 2011-11-12 MED ORDER — ONDANSETRON HCL 4 MG/2ML IJ SOLN: 4.0000 mg | INTRAMUSCULAR | Status: DC | PRN

## 2011-11-12 MED ORDER — ZOLPIDEM TARTRATE 5 MG PO TABS: 5.0000 mg | ORAL_TABLET | Freq: Every evening | ORAL | Status: DC | PRN

## 2011-11-12 MED ORDER — IBUPROFEN 600 MG PO TABS
600.0000 mg | ORAL_TABLET | Freq: Four times a day (QID) | ORAL | Status: DC | PRN
  Administered 2011-11-12: 600 mg via ORAL
  Filled 2011-11-12: qty 1

## 2011-11-12 MED ORDER — CITRIC ACID-SODIUM CITRATE 334-500 MG/5ML PO SOLN: 30.0000 mL | ORAL | Status: DC | PRN

## 2011-11-12 MED ORDER — SIMETHICONE 80 MG PO CHEW: 80.0000 mg | CHEWABLE_TABLET | ORAL | Status: DC | PRN

## 2011-11-12 MED ORDER — TETANUS-DIPHTH-ACELL PERTUSSIS 5-2.5-18.5 LF-MCG/0.5 IM SUSP: 0.5000 mL | Freq: Once | INTRAMUSCULAR | Status: DC

## 2011-11-12 MED ORDER — SENNOSIDES-DOCUSATE SODIUM 8.6-50 MG PO TABS
2.0000 | ORAL_TABLET | Freq: Every day | ORAL | Status: DC
  Administered 2011-11-12 – 2011-11-13 (×2): 2 via ORAL

## 2011-11-12 MED ORDER — DIPHENHYDRAMINE HCL 25 MG PO CAPS: 25.0000 mg | ORAL_CAPSULE | Freq: Four times a day (QID) | ORAL | Status: DC | PRN

## 2011-11-12 MED ORDER — LACTATED RINGERS IV SOLN: 500.0000 mL | INTRAVENOUS | Status: DC | PRN

## 2011-11-12 MED ORDER — LANOLIN HYDROUS EX OINT: TOPICAL_OINTMENT | CUTANEOUS | Status: DC | PRN

## 2011-11-12 MED ORDER — OXYTOCIN BOLUS FROM INFUSION
500.0000 mL | Freq: Once | INTRAVENOUS | Status: AC
  Administered 2011-11-12: 500 mL via INTRAVENOUS
  Filled 2011-11-12: qty 500

## 2011-11-12 MED ORDER — FLEET ENEMA 7-19 GM/118ML RE ENEM: 1.0000 | ENEMA | RECTAL | Status: DC | PRN

## 2011-11-12 MED ORDER — WITCH HAZEL-GLYCERIN EX PADS: 1.0000 "application " | MEDICATED_PAD | CUTANEOUS | Status: DC | PRN

## 2011-11-12 MED ORDER — CEFTRIAXONE SODIUM 1 G IJ SOLR
1.0000 g | Freq: Once | INTRAMUSCULAR | Status: AC
  Administered 2011-11-12: 1 g via INTRAMUSCULAR
  Filled 2011-11-12: qty 10

## 2011-11-12 MED ORDER — BENZOCAINE-MENTHOL 20-0.5 % EX AERO
1.0000 "application " | INHALATION_SPRAY | CUTANEOUS | Status: DC | PRN
  Filled 2011-11-12: qty 56

## 2011-11-12 MED ORDER — LACTATED RINGERS IV SOLN
INTRAVENOUS | Status: DC
  Administered 2011-11-12: 05:00:00 via INTRAVENOUS

## 2011-11-12 MED ORDER — ONDANSETRON HCL 4 MG/2ML IJ SOLN
4.0000 mg | Freq: Four times a day (QID) | INTRAMUSCULAR | Status: DC | PRN
  Administered 2011-11-12: 4 mg via INTRAVENOUS
  Filled 2011-11-12: qty 2

## 2011-11-12 MED ORDER — PNEUMOCOCCAL VAC POLYVALENT 25 MCG/0.5ML IJ INJ
0.5000 mL | INJECTION | INTRAMUSCULAR | Status: AC
  Administered 2011-11-12: 0.5 mL via INTRAMUSCULAR
  Filled 2011-11-12: qty 0.5

## 2011-11-12 MED ORDER — IBUPROFEN 600 MG PO TABS
600.0000 mg | ORAL_TABLET | Freq: Four times a day (QID) | ORAL | Status: DC
  Administered 2011-11-12 – 2011-11-13 (×6): 600 mg via ORAL
  Filled 2011-11-12 (×6): qty 1

## 2011-11-12 MED ORDER — ONDANSETRON HCL 4 MG PO TABS
4.0000 mg | ORAL_TABLET | ORAL | Status: DC | PRN
  Filled 2011-11-12: qty 1

## 2011-11-12 NOTE — H&P (Signed)
Lisa Jones is a 26 y.o. female presenting for contractions since 5am.  Water broke in MAU- light mec, some bloody show. +FM.  Maternal Medical History:  Reason for admission: Reason for admission: contractions.  Contractions: Onset was 3-5 hours ago.   Frequency: regular.   Perceived severity is strong.    Fetal activity: Perceived fetal activity is normal.    Prenatal complications: Bleeding.   No hypertension, pre-eclampsia or preterm labor.   Prenatal Complications - Diabetes: none.    OB History    Grav Para Term Preterm Abortions TAB SAB Ect Mult Living   7 2 2  4  4   2      Past Medical History  Diagnosis Date  . Suicidal ideation   . Poor dentition   . Bipolar 1 disorder   . Manic depression    Past Surgical History  Procedure Date  . Mandible fracture surgery    Family History: family history includes Heart disease in her father.  There is no history of Other. Social History:  reports that she has been smoking Cigarettes.  She has been smoking about .5 packs per day. She has never used smokeless tobacco. She reports that she does not drink alcohol or use illicit drugs.   Prenatal Transfer Tool  Maternal Diabetes: No Genetic Screening: Declined Maternal Ultrasounds/Referrals: Normal Fetal Ultrasounds or other Referrals:  None Maternal Substance Abuse:  Yes:  Type: Smoker Significant Maternal Medications:  None Significant Maternal Lab Results:  Lab values include: Group B Strep negative Other Comments:  late to [facility] (dating based on 32 wk U/S)  ROS  Dilation: 8.5 Effacement (%): 100 Station: 0 Exam by:: MCGILLL Blood pressure 138/91, pulse 72, last menstrual period 02/06/2011, unknown if currently breastfeeding. Maternal Exam:  Uterine Assessment: Contraction strength is firm.  Contraction frequency is regular.   Abdomen: Fetal presentation: vertex  Pelvis: adequate for delivery.   Cervix: Cervix evaluated by digital exam.     Fetal  Exam Fetal Monitor Review: Baseline rate: 140.  Pattern: accelerations present and no decelerations.    Fetal State Assessment: Category I - tracings are normal.     Physical Exam  Constitutional: She appears well-nourished. She appears distressed.  Cardiovascular: Normal rate and regular rhythm.   No murmur heard. Respiratory: Effort normal and breath sounds normal.  GI:       gravid  Musculoskeletal: She exhibits no edema.  Skin: Skin is warm and dry.    Prenatal labs: ABO, Rh: --/--/AB POS (08/10 2158) Antibody:  neg Rubella: 226.4 (08/10 2158) RPR: NON REACTIVE (08/10 2158)  HBsAg: NEGATIVE (08/10 2158)  HIV: NON REACTIVE (08/10 2158)  GBS:   neg  Assessment/Plan: 26 yo [id number] @ 38.5 weeks by 3rd tri U/S p/w active labor -Admit to L&D -GBS neg -1/2 to 1 ppd smoker -Late/little [facility] -Pt desires BTL but has not signed tubal paperwork -Breast -Female, no circ   Cathlin Buchan 11/12/2011, 5:21 AM

## 2011-11-12 NOTE — H&P (Signed)
Attestation of Attending Supervision of Advanced Practitioner (CNM/NP): Evaluation and management procedures were performed by the Advanced Practitioner under my supervision and collaboration.  I have reviewed the Advanced Practitioner's note and chart, and I agree with the management and plan.  Raaga Maeder 11/12/2011 7:27 AM

## 2011-11-12 NOTE — Progress Notes (Signed)
Patient transferred to [facility] in wheelchair with baby in arms. Report given to floor nurse, Dorene Grebe

## 2011-11-12 NOTE — MAU Note (Signed)
ARRIVED VIA EMS- FOR LABOR CHECK.-  PAIN  STARTED AT 0300.   DENIES HSV  AND MRSA.

## 2011-11-13 MED ORDER — IBUPROFEN 600 MG PO TABS: 600.0000 mg | ORAL_TABLET | Freq: Four times a day (QID) | ORAL | Status: DC

## 2011-11-13 NOTE — Progress Notes (Signed)
UR Chart review completed.  

## 2011-11-13 NOTE — Discharge Summary (Signed)
Obstetric Discharge Summary Reason for Admission: onset of labor Prenatal Procedures: none Intrapartum Procedures: spontaneous vaginal delivery Postpartum Procedures: none Complications-Operative and Postpartum: none Hemoglobin  Date Value Range Status  11/12/2011 12.9  12.0 - 15.0 g/dL Final     HCT  Date Value Range Status  11/12/2011 38.1  36.0 - 46.0 % Final    Physical Exam:  General: alert, cooperative and no distress Lochia: appropriate Uterine Fundus: firm DVT Evaluation: No evidence of DVT seen on physical exam.  Discharge Diagnoses: Term Pregnancy-delivered  Discharge Information: Date: 11/13/2011 Activity: pelvic rest Diet: routine Medications: PNV and Ibuprofen Condition: stable Instructions: refer to practice specific booklet Discharge to: home Follow-up Information    Follow up with [facility] (Make an appointment for 4-6 weeks postpartum. Mention that you will also need it to be a pre-op visit so that your tubal can be scheduled.)    Contact information:   [address] [place] [address]          Newborn Data: Live born female  Birth Weight: 7 lb 3.9 oz (3285 g) APGAR: 8, 9  Home with mother. Breastfeeding going well. Desires BTL for contraception; will have FP staff sign 30-d papers with pt prior to d/c, and then will have pre-op visit at time of postpartum visit for interval tubal.  Cam Hai 11/13/2011, 7:52 AM

## 2011-11-14 NOTE — Clinical Social Work Maternal (Addendum)
LATE ENTRY FROM 11/13/11:  Clinical Social Work Department PSYCHOSOCIAL ASSESSMENT - MATERNAL/CHILD 11/14/2011  Patient:  Lisa Jones, Lisa Jones  Account Number:  [id number]  Admit Date:  11/12/2011  Marjo Bicker Name:   Lisa Jones    Clinical Social Worker:  Nobie Putnam, Theresia Majors   Date/Time:  11/13/2011 02:43 PM  Date Referred:  11/13/2011   Referral source  CN     Referred reason  Behavioral Health Issues   Other referral source:    I:  FAMILY / HOME ENVIRONMENT Child's legal guardian:  PARENT  Guardian - Name Guardian - Age Guardian - Address  Lisa Jones [address] [place], [place] [address]  Lisa Jones 26 (same as above)   Other household support members/support persons Name Relationship DOB   GRAND MOTHER    Other support:   FOB's family    II  PSYCHOSOCIAL DATA Information Source:  Family Interview  Surveyor, quantity and [employer] Employment:   Surveyor, quantity resources:  [employer] If Medicaid - County:  [employer] Other  Chemical engineer / Grade:   Maternity Care Coordinator / Child Services Coordination / Early Interventions:  Cultural issues impacting care:    III  STRENGTHS Strengths  Adequate Resources  Home prepared for Child (including basic supplies)  Supportive family/friends   Strength comment:    IV  RISK FACTORS AND CURRENT PROBLEMS Current Problem:  YES   Risk Factor & Current Problem Patient Issue Family Issue Risk Factor / Current Problem Comment  Mental Illness Y N Hx of Manic depression, bipolar disorder and SI  Other - See comment Y N Pt loss custody of other children    V  SOCIAL WORK ASSESSMENT Sw met with pt and FOB to assess their current social situation and pt's mental health.  Pt told Sw that she was diagnosed with mental illnesses, approximately 2-3 years ago by a physician at [facility] of the [place].  She was prescribed medication, of which she reports taking for a short period of time.  Her  mental illnesses's are not being treated at this time, as she reports feeling fine. Pt is originally from [place], [place] and has traveled between the 2 states.  She and FOB came back to the area, 2 months ago to live with his grandmother.  According to the parents, they plan to stay here long term.  Pt has 2 other children, Lisa Jones (DOB 06/25/06) and Lisa Jones (DOB 11/01/10), both of whom she loss custody due to CPS involvement.  Pt explained that she was on drugs , crack cocaine at the time and unstable.  Her mother has custody of her oldest child and her youngest daughter was adopted by family.  Both children are in [place].  She denies substance use in the past 4 years.  FOB denies history of substance use.  Sw explained that a CPS report would be made since she admits to loosing custody of her children.  This Sw is not able to determine if this family is stable at this time and therefore will request an investigation.  Parents seemed to understand protocol.  Pt reports having all the necessary supplies for the infant.  Sw will follow up with the family and inform staff of discharge plans.    Case was accepted and assigned to Marcelle Smiling.  Sw left CPS worker a message advising of tentative discharge tomorrow.      VI SOCIAL WORK PLAN Social Work Merchandiser, retail Report  No Further Intervention Required / No Barriers to Discharge   Type of pt/family education:   If child protective services report - county:  [place] If child protective services report - date:  11/13/2011 Information/referral to community resources comment:   Other social work plan:

## 2011-11-17 ENCOUNTER — Encounter: Payer: Self-pay | Admitting: Advanced Practice Midwife

## 2011-12-17 ENCOUNTER — Ambulatory Visit: Payer: Self-pay | Admitting: Advanced Practice Midwife

## 2013-12-19 ENCOUNTER — Encounter (HOSPITAL_COMMUNITY): Payer: Self-pay | Admitting: *Deleted

## 2014-02-17 NOTE — L&D Delivery Note (Cosign Needed)
Delivery Note At 12:34 AM a viable female was delivered at home. Pt arrived via EMS  The placenta had also delivered and was abrought in a bag.  FF @ u-2, lochia normal.  C/O nausea.  Baby doing well.  Anesthesia: None  Episiotomy: None Lacerations: None Suture Repair: n/a Est. Blood Loss (mL):  <500 cc  Mom to postpartum.  Baby to Couplet care / Skin to Skin.  CRESENZO-DISHMAN,Lisa Jones 06/16/2014, 1:29 AM

## 2014-03-20 ENCOUNTER — Other Ambulatory Visit (HOSPITAL_COMMUNITY): Payer: Self-pay | Admitting: Physician Assistant

## 2014-03-20 DIAGNOSIS — Z3689 Encounter for other specified antenatal screening: Secondary | ICD-10-CM

## 2014-03-23 ENCOUNTER — Ambulatory Visit (HOSPITAL_COMMUNITY)
Admission: RE | Admit: 2014-03-23 | Discharge: 2014-03-23 | Disposition: A | Discharge status: Home / Self Care | Payer: Medicaid Other | Source: Ambulatory Visit | Attending: Physician Assistant | Admitting: Physician Assistant

## 2014-03-23 ENCOUNTER — Encounter (HOSPITAL_COMMUNITY): Payer: Self-pay

## 2014-03-23 DIAGNOSIS — O99332 Smoking (tobacco) complicating pregnancy, second trimester: Secondary | ICD-10-CM | POA: Insufficient documentation

## 2014-03-23 DIAGNOSIS — O0932 Supervision of pregnancy with insufficient antenatal care, second trimester: Secondary | ICD-10-CM | POA: Insufficient documentation

## 2014-03-23 DIAGNOSIS — Z3689 Encounter for other specified antenatal screening: Secondary | ICD-10-CM

## 2014-03-23 DIAGNOSIS — F17210 Nicotine dependence, cigarettes, uncomplicated: Secondary | ICD-10-CM | POA: Insufficient documentation

## 2014-03-23 DIAGNOSIS — Z3A26 26 weeks gestation of pregnancy: Secondary | ICD-10-CM | POA: Diagnosis not present

## 2014-03-30 ENCOUNTER — Other Ambulatory Visit (HOSPITAL_COMMUNITY): Payer: Self-pay | Admitting: Physician Assistant

## 2014-03-30 DIAGNOSIS — Z0374 Encounter for suspected problem with fetal growth ruled out: Secondary | ICD-10-CM

## 2014-04-05 ENCOUNTER — Encounter: Payer: Self-pay | Admitting: *Deleted

## 2014-04-20 ENCOUNTER — Ambulatory Visit (HOSPITAL_COMMUNITY)
Admission: RE | Admit: 2014-04-20 | Discharge: 2014-04-20 | Disposition: A | Discharge status: Home / Self Care | Payer: Medicaid Other | Source: Ambulatory Visit | Attending: Physician Assistant | Admitting: Physician Assistant

## 2014-04-20 DIAGNOSIS — O0933 Supervision of pregnancy with insufficient antenatal care, third trimester: Secondary | ICD-10-CM | POA: Diagnosis not present

## 2014-04-20 DIAGNOSIS — F17210 Nicotine dependence, cigarettes, uncomplicated: Secondary | ICD-10-CM | POA: Diagnosis not present

## 2014-04-20 DIAGNOSIS — O99330 Smoking (tobacco) complicating pregnancy, unspecified trimester: Secondary | ICD-10-CM | POA: Insufficient documentation

## 2014-04-20 DIAGNOSIS — O99333 Smoking (tobacco) complicating pregnancy, third trimester: Secondary | ICD-10-CM | POA: Insufficient documentation

## 2014-04-20 DIAGNOSIS — Z0374 Encounter for suspected problem with fetal growth ruled out: Secondary | ICD-10-CM | POA: Insufficient documentation

## 2014-04-20 DIAGNOSIS — Z36 Encounter for antenatal screening of mother: Secondary | ICD-10-CM | POA: Diagnosis present

## 2014-04-20 DIAGNOSIS — O09293 Supervision of pregnancy with other poor reproductive or obstetric history, third trimester: Secondary | ICD-10-CM | POA: Insufficient documentation

## 2014-04-20 DIAGNOSIS — Z3A30 30 weeks gestation of pregnancy: Secondary | ICD-10-CM | POA: Diagnosis not present

## 2014-05-19 ENCOUNTER — Other Ambulatory Visit (HOSPITAL_COMMUNITY): Payer: Self-pay | Admitting: Urology

## 2014-05-19 DIAGNOSIS — O365930 Maternal care for other known or suspected poor fetal growth, third trimester, not applicable or unspecified: Secondary | ICD-10-CM

## 2014-05-23 ENCOUNTER — Ambulatory Visit (HOSPITAL_COMMUNITY)
Admission: RE | Admit: 2014-05-23 | Discharge: 2014-05-23 | Disposition: A | Discharge status: Home / Self Care | Payer: Medicaid Other | Source: Ambulatory Visit | Attending: Urology | Admitting: Urology

## 2014-05-23 DIAGNOSIS — Z0489 Encounter for examination and observation for other specified reasons: Secondary | ICD-10-CM | POA: Insufficient documentation

## 2014-05-23 DIAGNOSIS — F17210 Nicotine dependence, cigarettes, uncomplicated: Secondary | ICD-10-CM | POA: Insufficient documentation

## 2014-05-23 DIAGNOSIS — O09299 Supervision of pregnancy with other poor reproductive or obstetric history, unspecified trimester: Secondary | ICD-10-CM | POA: Insufficient documentation

## 2014-05-23 DIAGNOSIS — O26849 Uterine size-date discrepancy, unspecified trimester: Secondary | ICD-10-CM | POA: Insufficient documentation

## 2014-05-23 DIAGNOSIS — O26843 Uterine size-date discrepancy, third trimester: Secondary | ICD-10-CM | POA: Insufficient documentation

## 2014-05-23 DIAGNOSIS — Z36 Encounter for antenatal screening of mother: Secondary | ICD-10-CM | POA: Diagnosis not present

## 2014-05-23 DIAGNOSIS — Z3A35 35 weeks gestation of pregnancy: Secondary | ICD-10-CM | POA: Diagnosis not present

## 2014-05-23 DIAGNOSIS — O3433 Maternal care for cervical incompetence, third trimester: Secondary | ICD-10-CM | POA: Diagnosis not present

## 2014-05-23 DIAGNOSIS — IMO0002 Reserved for concepts with insufficient information to code with codable children: Secondary | ICD-10-CM | POA: Insufficient documentation

## 2014-05-23 DIAGNOSIS — O365930 Maternal care for other known or suspected poor fetal growth, third trimester, not applicable or unspecified: Secondary | ICD-10-CM | POA: Insufficient documentation

## 2014-05-23 DIAGNOSIS — O99333 Smoking (tobacco) complicating pregnancy, third trimester: Secondary | ICD-10-CM | POA: Diagnosis not present

## 2014-06-16 ENCOUNTER — Inpatient Hospital Stay (HOSPITAL_COMMUNITY): Payer: Medicaid Other | Admitting: Certified Registered Nurse Anesthetist

## 2014-06-16 ENCOUNTER — Encounter (HOSPITAL_COMMUNITY): Payer: Self-pay

## 2014-06-16 ENCOUNTER — Encounter (HOSPITAL_COMMUNITY)
Admission: AD | Disposition: A | Discharge status: Home / Self Care | Payer: Self-pay | Source: Ambulatory Visit | Attending: Obstetrics & Gynecology

## 2014-06-16 ENCOUNTER — Inpatient Hospital Stay (HOSPITAL_COMMUNITY)
Admission: AD | Admit: 2014-06-16 | Discharge: 2014-06-18 | DRG: 781 | Disposition: A | Discharge status: Home / Self Care | Payer: Medicaid Other | Source: Ambulatory Visit | Attending: Obstetrics and Gynecology | Admitting: Obstetrics and Gynecology

## 2014-06-16 DIAGNOSIS — O1490 Unspecified pre-eclampsia, unspecified trimester: Secondary | ICD-10-CM | POA: Diagnosis present

## 2014-06-16 DIAGNOSIS — O99335 Smoking (tobacco) complicating the puerperium: Secondary | ICD-10-CM | POA: Diagnosis present

## 2014-06-16 DIAGNOSIS — F17210 Nicotine dependence, cigarettes, uncomplicated: Secondary | ICD-10-CM | POA: Diagnosis present

## 2014-06-16 DIAGNOSIS — F319 Bipolar disorder, unspecified: Secondary | ICD-10-CM | POA: Diagnosis present

## 2014-06-16 DIAGNOSIS — O9989 Other specified diseases and conditions complicating pregnancy, childbirth and the puerperium: Principal | ICD-10-CM | POA: Diagnosis present

## 2014-06-16 DIAGNOSIS — Z23 Encounter for immunization: Secondary | ICD-10-CM | POA: Diagnosis not present

## 2014-06-16 DIAGNOSIS — O99345 Other mental disorders complicating the puerperium: Secondary | ICD-10-CM | POA: Diagnosis present

## 2014-06-16 DIAGNOSIS — Z885 Allergy status to narcotic agent status: Secondary | ICD-10-CM | POA: Diagnosis not present

## 2014-06-16 DIAGNOSIS — Z302 Encounter for sterilization: Secondary | ICD-10-CM | POA: Diagnosis not present

## 2014-06-16 HISTORY — PX: TUBAL LIGATION: SHX77

## 2014-06-16 LAB — COMPREHENSIVE METABOLIC PANEL
ALBUMIN: 2.8 g/dL — AB (ref 3.5–5.2)
ALK PHOS: 125 U/L — AB (ref 39–117)
ALT: 16 U/L (ref 0–35)
AST: 22 U/L (ref 0–37)
Anion gap: 5 (ref 5–15)
BUN: 15 mg/dL (ref 6–23)
CO2: 19 mmol/L (ref 19–32)
CREATININE: 0.65 mg/dL (ref 0.50–1.10)
Calcium: 7.8 mg/dL — ABNORMAL LOW (ref 8.4–10.5)
Chloride: 109 mmol/L (ref 96–112)
GFR calc Af Amer: >90 mL/min (ref >90)
GFR calc non Af Amer: >90 mL/min (ref >90)
GLUCOSE: 94 mg/dL (ref 70–99)
Potassium: 4.2 mmol/L (ref 3.5–5.1)
Sodium: 133 mmol/L — ABNORMAL LOW (ref 135–145)
TOTAL PROTEIN: 6.1 g/dL (ref 6.0–8.3)
Total Bilirubin: 0.4 mg/dL (ref 0.3–1.2)

## 2014-06-16 LAB — CBC
HCT: 25.8 % — ABNORMAL LOW (ref 36.0–46.0)
Hemoglobin: 8 g/dL — ABNORMAL LOW (ref 12.0–15.0)
MCH: 23.5 pg — AB (ref 26.0–34.0)
MCHC: 31 g/dL (ref 30.0–36.0)
MCV: 75.7 fL — ABNORMAL LOW (ref 78.0–100.0)
Platelets: 347 10*3/uL (ref 150–400)
RBC: 3.41 MIL/uL — ABNORMAL LOW (ref 3.87–5.11)
RDW: 16.2 % — AB (ref 11.5–15.5)
WBC: 15.3 10*3/uL — ABNORMAL HIGH (ref 4.0–10.5)

## 2014-06-16 LAB — PROTEIN / CREATININE RATIO, URINE
Creatinine, Urine: 190 mg/dL
Protein Creatinine Ratio: 0.75 mg/mg{Cre} — ABNORMAL HIGH (ref 0.00–0.15)
TOTAL PROTEIN, URINE: 143 mg/dL

## 2014-06-16 LAB — RPR: RPR Ser Ql: NONREACTIVE

## 2014-06-16 LAB — SURGICAL PCR SCREEN
MRSA, PCR: NEGATIVE
Staphylococcus aureus: POSITIVE — AB

## 2014-06-16 SURGERY — LIGATION, FALLOPIAN TUBE, POSTPARTUM
Anesthesia: Spinal | Site: Abdomen

## 2014-06-16 MED ORDER — METHYLERGONOVINE MALEATE 0.2 MG PO TABS: 0.2000 mg | ORAL_TABLET | ORAL | Status: DC | PRN

## 2014-06-16 MED ORDER — SIMETHICONE 80 MG PO CHEW
80.0000 mg | CHEWABLE_TABLET | ORAL | Status: DC | PRN
  Administered 2014-06-17 – 2014-06-18 (×3): 80 mg via ORAL
  Filled 2014-06-16 (×2): qty 1

## 2014-06-16 MED ORDER — WITCH HAZEL-GLYCERIN EX PADS
1.0000 "application " | MEDICATED_PAD | CUTANEOUS | Status: DC | PRN
  Administered 2014-06-16: 1 via TOPICAL

## 2014-06-16 MED ORDER — DIBUCAINE 1 % RE OINT
1.0000 "application " | TOPICAL_OINTMENT | RECTAL | Status: DC | PRN
  Administered 2014-06-16: 1 via RECTAL
  Filled 2014-06-16: qty 28

## 2014-06-16 MED ORDER — LACTATED RINGERS IV SOLN
INTRAVENOUS | Status: DC
  Administered 2014-06-16 (×2): via INTRAVENOUS

## 2014-06-16 MED ORDER — METHYLERGONOVINE MALEATE 0.2 MG/ML IJ SOLN: 0.2000 mg | INTRAMUSCULAR | Status: DC | PRN

## 2014-06-16 MED ORDER — FERROUS SULFATE 325 (65 FE) MG PO TABS
325.0000 mg | ORAL_TABLET | Freq: Two times a day (BID) | ORAL | Status: DC
  Administered 2014-06-16 – 2014-06-18 (×4): 325 mg via ORAL
  Filled 2014-06-16 (×4): qty 1

## 2014-06-16 MED ORDER — FENTANYL CITRATE (PF) 100 MCG/2ML IJ SOLN
INTRAMUSCULAR | Status: AC
  Filled 2014-06-16: qty 2

## 2014-06-16 MED ORDER — ONDANSETRON HCL 4 MG/2ML IJ SOLN: 4.0000 mg | INTRAMUSCULAR | Status: DC | PRN

## 2014-06-16 MED ORDER — MIDAZOLAM HCL 2 MG/2ML IJ SOLN
INTRAMUSCULAR | Status: AC
  Filled 2014-06-16: qty 2

## 2014-06-16 MED ORDER — HYDROCODONE-ACETAMINOPHEN 7.5-325 MG PO TABS: 1.0000 | ORAL_TABLET | Freq: Once | ORAL | Status: DC | PRN

## 2014-06-16 MED ORDER — LANOLIN HYDROUS EX OINT: TOPICAL_OINTMENT | CUTANEOUS | Status: DC | PRN

## 2014-06-16 MED ORDER — FENTANYL CITRATE (PF) 100 MCG/2ML IJ SOLN
INTRAMUSCULAR | Status: DC | PRN
  Administered 2014-06-16: 25 ug via INTRAVENOUS

## 2014-06-16 MED ORDER — BENZOCAINE-MENTHOL 20-0.5 % EX AERO
1.0000 "application " | INHALATION_SPRAY | CUTANEOUS | Status: DC | PRN
  Administered 2014-06-16: 1 via TOPICAL
  Filled 2014-06-16: qty 56

## 2014-06-16 MED ORDER — METOCLOPRAMIDE HCL 10 MG PO TABS
10.0000 mg | ORAL_TABLET | Freq: Once | ORAL | Status: AC
Start: 2014-06-16 — End: 2014-06-16
  Administered 2014-06-16: 10 mg via ORAL

## 2014-06-16 MED ORDER — PNEUMOCOCCAL VAC POLYVALENT 25 MCG/0.5ML IJ INJ
0.5000 mL | INJECTION | INTRAMUSCULAR | Status: AC
  Administered 2014-06-18: 0.5 mL via INTRAMUSCULAR
  Filled 2014-06-16 (×2): qty 0.5

## 2014-06-16 MED ORDER — BUPIVACAINE HCL (PF) 0.25 % IJ SOLN
INTRAMUSCULAR | Status: DC | PRN
  Administered 2014-06-16: 8 mL

## 2014-06-16 MED ORDER — BISACODYL 10 MG RE SUPP: 10.0000 mg | Freq: Every day | RECTAL | Status: DC | PRN

## 2014-06-16 MED ORDER — METOCLOPRAMIDE HCL 5 MG/ML IJ SOLN: 10.0000 mg | Freq: Once | INTRAMUSCULAR | Status: AC | PRN

## 2014-06-16 MED ORDER — TETANUS-DIPHTH-ACELL PERTUSSIS 5-2.5-18.5 LF-MCG/0.5 IM SUSP: 0.5000 mL | Freq: Once | INTRAMUSCULAR | Status: DC

## 2014-06-16 MED ORDER — PRENATAL MULTIVITAMIN CH
1.0000 | ORAL_TABLET | Freq: Every day | ORAL | Status: DC
  Administered 2014-06-16 – 2014-06-18 (×3): 1 via ORAL
  Filled 2014-06-16 (×3): qty 1

## 2014-06-16 MED ORDER — FLEET ENEMA 7-19 GM/118ML RE ENEM: 1.0000 | ENEMA | Freq: Every day | RECTAL | Status: DC | PRN

## 2014-06-16 MED ORDER — SENNOSIDES-DOCUSATE SODIUM 8.6-50 MG PO TABS
2.0000 | ORAL_TABLET | ORAL | Status: DC
  Administered 2014-06-17 – 2014-06-18 (×2): 2 via ORAL
  Filled 2014-06-16 (×2): qty 2

## 2014-06-16 MED ORDER — DIPHENHYDRAMINE HCL 25 MG PO CAPS
25.0000 mg | ORAL_CAPSULE | Freq: Four times a day (QID) | ORAL | Status: DC | PRN
  Filled 2014-06-16: qty 1

## 2014-06-16 MED ORDER — LACTATED RINGERS IV SOLN: INTRAVENOUS | Status: DC

## 2014-06-16 MED ORDER — OXYCODONE-ACETAMINOPHEN 5-325 MG PO TABS
1.0000 | ORAL_TABLET | Freq: Four times a day (QID) | ORAL | Status: DC | PRN
  Administered 2014-06-16: 2 via ORAL
  Administered 2014-06-17: 1 via ORAL
  Administered 2014-06-17: 2 via ORAL
  Administered 2014-06-18: 1 via ORAL
  Filled 2014-06-16: qty 2
  Filled 2014-06-16 (×2): qty 1
  Filled 2014-06-16: qty 2

## 2014-06-16 MED ORDER — FENTANYL CITRATE (PF) 100 MCG/2ML IJ SOLN: 25.0000 ug | INTRAMUSCULAR | Status: DC | PRN

## 2014-06-16 MED ORDER — ONDANSETRON HCL 4 MG PO TABS
4.0000 mg | ORAL_TABLET | ORAL | Status: DC | PRN
  Administered 2014-06-16 – 2014-06-17 (×2): 4 mg via ORAL
  Filled 2014-06-16 (×2): qty 1

## 2014-06-16 MED ORDER — PROMETHAZINE HCL 25 MG/ML IJ SOLN: 12.5000 mg | Freq: Once | INTRAMUSCULAR | Status: DC

## 2014-06-16 MED ORDER — HYDROMORPHONE HCL 1 MG/ML IJ SOLN
0.2500 mg | INTRAMUSCULAR | Status: DC | PRN
  Administered 2014-06-16: 0.5 mg via INTRAVENOUS

## 2014-06-16 MED ORDER — FAMOTIDINE 20 MG PO TABS
40.0000 mg | ORAL_TABLET | Freq: Once | ORAL | Status: AC
  Administered 2014-06-16: 40 mg via ORAL
  Filled 2014-06-16: qty 2

## 2014-06-16 MED ORDER — PROMETHAZINE HCL 25 MG/ML IJ SOLN
12.5000 mg | Freq: Once | INTRAMUSCULAR | Status: AC
  Administered 2014-06-16: 12.5 mg via INTRAVENOUS
  Filled 2014-06-16: qty 1

## 2014-06-16 MED ORDER — IBUPROFEN 600 MG PO TABS
600.0000 mg | ORAL_TABLET | Freq: Four times a day (QID) | ORAL | Status: DC
  Administered 2014-06-16 – 2014-06-18 (×10): 600 mg via ORAL
  Filled 2014-06-16 (×10): qty 1

## 2014-06-16 MED ORDER — MIDAZOLAM HCL 2 MG/2ML IJ SOLN
INTRAMUSCULAR | Status: DC | PRN
  Administered 2014-06-16: 0.5 mg via INTRAVENOUS

## 2014-06-16 MED ORDER — OXYTOCIN 40 UNITS IN LACTATED RINGERS INFUSION - SIMPLE MED: 62.5000 mL/h | INTRAVENOUS | Status: DC | PRN

## 2014-06-16 MED ORDER — BUPIVACAINE IN DEXTROSE 0.75-8.25 % IT SOLN
INTRATHECAL | Status: DC | PRN
  Administered 2014-06-16: 1.2 mL via INTRATHECAL

## 2014-06-16 MED ORDER — MEASLES, MUMPS & RUBELLA VAC ~~LOC~~ INJ
0.5000 mL | INJECTION | Freq: Once | SUBCUTANEOUS | Status: DC
  Filled 2014-06-16: qty 0.5

## 2014-06-16 MED ORDER — MEPERIDINE HCL 25 MG/ML IJ SOLN: 6.2500 mg | INTRAMUSCULAR | Status: DC | PRN

## 2014-06-16 MED ORDER — KETOROLAC TROMETHAMINE 30 MG/ML IJ SOLN: 30.0000 mg | Freq: Once | INTRAMUSCULAR | Status: AC | PRN

## 2014-06-16 MED ORDER — ACETAMINOPHEN 325 MG PO TABS
650.0000 mg | ORAL_TABLET | ORAL | Status: DC | PRN
  Administered 2014-06-16 – 2014-06-18 (×2): 650 mg via ORAL
  Filled 2014-06-16 (×3): qty 2

## 2014-06-16 MED ORDER — ZOLPIDEM TARTRATE 5 MG PO TABS: 5.0000 mg | ORAL_TABLET | Freq: Every evening | ORAL | Status: DC | PRN

## 2014-06-16 MED ORDER — BUPIVACAINE HCL (PF) 0.25 % IJ SOLN
INTRAMUSCULAR | Status: AC
  Filled 2014-06-16: qty 30

## 2014-06-16 MED ORDER — PROMETHAZINE HCL 25 MG/ML IJ SOLN: 6.2500 mg | INTRAMUSCULAR | Status: DC | PRN

## 2014-06-16 SURGICAL SUPPLY — 23 items
BLADE SURG 11 STRL SS (BLADE) ×5 IMPLANT
CHLORAPREP W/TINT 26ML (MISCELLANEOUS) ×3 IMPLANT
CLIP FILSHIE TUBAL LIGA STRL (Clip) ×5 IMPLANT
CLOTH BEACON ORANGE TIMEOUT ST (SAFETY) ×3 IMPLANT
DRSG OPSITE POSTOP 3X4 (GAUZE/BANDAGES/DRESSINGS) ×3 IMPLANT
GLOVE BIO SURGEON STRL SZ 6.5 (GLOVE) ×2 IMPLANT
GLOVE BIO SURGEONS STRL SZ 6.5 (GLOVE) ×1
GLOVE BIOGEL PI IND STRL 7.0 (GLOVE) ×1 IMPLANT
GLOVE BIOGEL PI INDICATOR 7.0 (GLOVE) ×2
GLOVE SURG SS PI 7.0 STRL IVOR (GLOVE) ×4 IMPLANT
GOWN STRL REUS W/TWL LRG LVL3 (GOWN DISPOSABLE) ×6 IMPLANT
NEEDLE HYPO 22GX1.5 SAFETY (NEEDLE) ×3 IMPLANT
NS IRRIG 1000ML POUR BTL (IV SOLUTION) ×3 IMPLANT
PACK ABDOMINAL MINOR (CUSTOM PROCEDURE TRAY) ×3 IMPLANT
SLEEVE SCD COMPRESS KNEE MED (MISCELLANEOUS) ×2 IMPLANT
SPONGE LAP 4X18 X RAY DECT (DISPOSABLE) IMPLANT
SUT VIC AB 0 CT1 27 (SUTURE) ×3
SUT VIC AB 0 CT1 27XBRD ANBCTR (SUTURE) ×1 IMPLANT
SUT VICRYL 4-0 PS2 18IN ABS (SUTURE) ×3 IMPLANT
SYR CONTROL 10ML LL (SYRINGE) ×3 IMPLANT
TOWEL OR 17X24 6PK STRL BLUE (TOWEL DISPOSABLE) ×6 IMPLANT
TRAY FOLEY BAG SILVER LF 16FR (SET/KITS/TRAYS/PACK) ×3 IMPLANT
WATER STERILE IRR 1000ML POUR (IV SOLUTION) ×3 IMPLANT

## 2014-06-16 NOTE — Progress Notes (Signed)
Spoke with faculty practice resident, Drenda FreezeFran, and let her know the results of the patients protein creatinine ratio.  No specific orders were given. Assess patient as normal and notify them of any blood pressures greater than or equal to 160/110. Patient denies any headache, blurred vision, and she does not have any abnormal swelling. Will continue to monitor.

## 2014-06-16 NOTE — Lactation Note (Signed)
This note was copied from the chart of Lisa Lisa Jones. Lactation Consultation Note Home delivery w/o complications. Experienced BF mom. States her 1st child wouldn't latch, her 2nd child BF 6 months, her 3rd child over 1 yr. No difficulties. Hand expression w/easy flow of colostrum. Has everted nipples, small areolas, and breast. Baby has been spitty of yellow sputum.  Mom encouraged to feed baby 8-12 times/24 hours and with feeding cues. Mom encouraged to waken baby for feeds. Mom reports + breast changes w/pregnancy. Educated about newborn behavior. Referred to Baby and Me Book in Breastfeeding section Pg. 22-23 for position options and Proper latch demonstration. Mom encouraged to do skin-to-skin. WH/LC brochure given w/resources, support groups and LC services.  Discussed supply and demand. States she has always had a good milk supply.  Patient Name: Lisa Lisa FlashKatherine Urbanik ONGEX'BToday's Date: 06/16/2014 Reason for consult: Initial assessment   Maternal Data Has patient been taught Hand Expression?: Yes Does the patient have breastfeeding experience prior to this delivery?: Yes  Feeding Length of feed: 45 min  LATCH Score/Interventions Latch: Grasps breast easily, tongue down, lips flanged, rhythmical sucking.  Audible Swallowing: Spontaneous and intermittent  Type of Nipple: Everted at rest and after stimulation  Comfort (Breast/Nipple): Soft / non-tender     Hold (Positioning): No assistance needed to correctly position infant at breast. Intervention(s): Breastfeeding basics reviewed;Support Pillows;Position options;Skin to skin  LATCH Score: 10  Lactation Tools Discussed/Used Tools: Pump Pump Review: Setup, frequency, and cleaning;Milk Storage Initiated by:: Peri JeffersonL. Rosann Gorum RN Date initiated:: 06/16/14   Consult Status Consult Status: Follow-up Date: 06/17/14 Follow-up type: In-patient    Charyl DancerCARVER, Sariyah Corcino G 06/16/2014, 6:35 PM

## 2014-06-16 NOTE — Progress Notes (Signed)
S/P SVD with no complications. She requests permanent sterilization, scheduled for PP BTL.  The procedure and the risk of anesthesia, bleeding, infection, bowel and bladder injury, failure (1/200) and ectopic pregnancy were discussed and her questions were answered.   Adam PhenixJames G Amiri Riechers, MD 06/16/2014 9:40 AM

## 2014-06-16 NOTE — Addendum Note (Signed)
Addendum  created 06/16/14 1251 by Leilani AbleFranklin Cheronda Erck, MD   Modules edited: Orders, PRL Based Order Sets

## 2014-06-16 NOTE — Anesthesia Postprocedure Evaluation (Signed)
  Anesthesia Post-op Note  Patient: Lisa Jones  Procedure(s) Performed: Procedure(s): POST PARTUM TUBAL LIGATION (N/A)  Patient Location: PACU  Anesthesia Type:Spinal  Level of Consciousness: awake, alert  and oriented  Airway and Oxygen Therapy: Patient Spontanous Breathing  Post-op Pain: none  Post-op Assessment: Post-op Vital signs reviewed, Patient's Cardiovascular Status Stable, Respiratory Function Stable, Patent Airway, No signs of Nausea or vomiting, Pain level controlled, No headache and No backache  Post-op Vital Signs: Reviewed and stable  Last Vitals:  Filed Vitals:   06/16/14 1200  BP:   Pulse: 64  Temp:   Resp: 16    Complications: No apparent anesthesia complications

## 2014-06-16 NOTE — MAU Note (Signed)
Spoke with [facility]BN, will transport patient & baby directly to rm 134.

## 2014-06-16 NOTE — Anesthesia Preprocedure Evaluation (Signed)
Anesthesia Evaluation  Patient identified by MRN, date of birth, ID band Patient awake    Reviewed: Allergy & Precautions, NPO status , Patient's Chart, lab work & pertinent test results  Airway Mallampati: II  TM Distance: >3 FB Neck ROM: Full    Dental no notable dental hx. (+) Poor Dentition, Missing   Pulmonary Current Smoker,  breath sounds clear to auscultation  Pulmonary exam normal       Cardiovascular negative cardio ROS  Rhythm:Regular Rate:Normal     Neuro/Psych PSYCHIATRIC DISORDERS Bipolar Disorder negative neurological ROS     GI/Hepatic negative GI ROS, Neg liver ROS,   Endo/Other  negative endocrine ROS  Renal/GU negative Renal ROS  negative genitourinary   Musculoskeletal negative musculoskeletal ROS (+)   Abdominal   Peds  Hematology  (+) anemia ,   Anesthesia Other Findings   Reproductive/Obstetrics Multiparity                             Anesthesia Physical Anesthesia Plan  ASA: II  Anesthesia Plan: Spinal   Post-op Pain Management:    Induction:   Airway Management Planned: Natural Airway  Additional Equipment:   Intra-op Plan:   Post-operative Plan:   Informed Consent: I have reviewed the patients History and Physical, chart, labs and discussed the procedure including the risks, benefits and alternatives for the proposed anesthesia with the patient or authorized representative who has indicated his/her understanding and acceptance.   Dental advisory given  Plan Discussed with: CRNA, Anesthesiologist and Surgeon  Anesthesia Plan Comments:         Anesthesia Quick Evaluation

## 2014-06-16 NOTE — Transfer of Care (Signed)
Immediate Anesthesia Transfer of Care Note  Patient: Lisa Jones  Procedure(s) Performed: Procedure(s): POST PARTUM TUBAL LIGATION (N/A)  Patient Location: PACU  Anesthesia Type:Spinal  Level of Consciousness: awake, alert  and oriented  Airway & Oxygen Therapy: Patient Spontanous Breathing  Post-op Assessment: Report given to RN and Post -op Vital signs reviewed and stable  Post vital signs: Reviewed and stable  Last Vitals:  Filed Vitals:   06/16/14 0826  BP: 132/61  Pulse: 69  Temp: 36.9 C  Resp: 18    Complications: No apparent anesthesia complications

## 2014-06-16 NOTE — H&P (Signed)
Lisa FlashKatherine Jones is a 29 y.o. female [phone] with IUP at 7258w0d presenting via EMS after delivering her baby and placenta at home.  She stated that the contractions never got bad until right at the end.  [facility].  Plans BTL, papers signed in February.   Prenatal History/Complications: Late Care SVD X4  Past Medical History: Past Medical History  Diagnosis Date  . Suicidal ideation   . Poor dentition   . Bipolar 1 disorder   . Manic depression     Past Surgical History: Past Surgical History  Procedure Laterality Date  . Mandible fracture surgery      Obstetrical History: OB History    Gravida Para Term Preterm AB TAB SAB Ectopic Multiple Living   8 3 3  4  4   3        Social History: History   Social History  . Marital Status: Single    Spouse Name: N/A  . Number of Children: N/A  . Years of Education: N/A   Social History Main Topics  . Smoking status: Current Every Day Smoker -- 0.50 packs/day    Types: Cigarettes  . Smokeless tobacco: Never Used  . Alcohol Use: No  . Drug Use: No  . Sexual Activity: Yes   Other Topics Concern  . Not on file   Social History Narrative    Family History: Family History  Problem Relation Age of Onset  . Other Neg Hx   . Heart disease Father     Allergies: Allergies  Allergen Reactions  . Codeine Nausea And Vomiting    Prescriptions prior to admission  Medication Sig Dispense Refill Last Dose  . acetaminophen (TYLENOL) 500 MG tablet Take 500 mg by mouth every 6 (six) hours as needed. For pain   Past Week at Unknown  . ibuprofen (ADVIL,MOTRIN) 600 MG tablet Take 1 tablet (600 mg total) by mouth every 6 (six) hours. 40 tablet 1   . Prenatal Vit-Fe Fumarate-FA (PRENATAL MULTIVITAMIN) TABS Take 1 tablet by mouth daily.   11/11/2011 at Unknown     Prenatal Transfer Tool  Maternal Diabetes: No Genetic Screening: Declined Maternal Ultrasounds/Referrals: Normal Fetal Ultrasounds or other  Referrals:  None Maternal Substance Abuse:  No Significant Maternal Medications:  None Significant Maternal Lab Results: None     Review of Systems   Constitutional: Negative for fever and chills Eyes: Negative for visual disturbances Respiratory: Negative for shortness of breath, dyspnea Cardiovascular: Negative for chest pain or palpitations  Gastrointestinal: Negative for vomiting, diarrhea and constipation.  POSITIVE for abdominal pain (contractions) Genitourinary: Negative for dysuria and urgency Musculoskeletal: Negative for back pain, joint pain, myalgias  Neurological: Negative for dizziness and headaches      Blood pressure 130/70, pulse 71, temperature 98.5 F (36.9 C), temperature source Oral, resp. rate 20, height 5\' 1"  (1.549 m), weight 55.792 kg (123 lb), last menstrual period 08/30/2013, unknown if currently breastfeeding. General appearance: alert, cooperative and mild distress Lungs: clear to auscultation bilaterally Heart: regular rate and rhythm Abdomen: soft, non-tender; bowel sounds normal.  Fundus firm at u-2 Pelvic: no lacs, blood loss minimal Extremities: Homans sign is negative, no sign of DVT DTR's 2+  Prenatal labs: ABO, Rh:  AB+ Antibody:  neg Rubella:  immune RPR:   neg HBsAg:   neg HIV:   neg GBS:   not on chart 1 hr Glucola 100 Genetic screening  Too late Anatomy US nornak   No results found for this  or any previous visit (from the past 24 hour(s)).  Assessment: Lisa Jones is a 29 y.o. [phone] with an IUP at [redacted]w[redacted]d presenting for post partum care/BTL  Plan: BTL 4/29 at 1045 Preeclampsia labs d/t sl elevated BP   CRESENZO-DISHMAN,Tatum Corl 06/16/2014, 1:39 AM

## 2014-06-16 NOTE — Op Note (Signed)
Lisa Jones 06/16/2014  PREOPERATIVE DIAGNOSIS:  Multiparity, undesired fertility  POSTOPERATIVE DIAGNOSIS:  Multiparity, undesired fertility  PROCEDURE:  Postpartum Bilateral Tubal Sterilization using Filshie Clips   ANESTHESIA:  Epidural  COMPLICATIONS:  None immediate.  ESTIMATED BLOOD LOSS:  Less than 20 ml.  FLUIDS: 1000 ml LR.  URINE OUTPUT:  50 ml of clear urine.  INDICATIONS: 29 y.o. [id number]  with undesired fertility,status post vaginal delivery, desires permanent sterilization. Risks and benefits of procedure discussed with patient including permanence of method, bleeding, infection, injury to surrounding organs and need for additional procedures. Risk failure of 0.5-1% with increased risk of ectopic gestation if pregnancy occurs was also discussed with patient.   FINDINGS:  Normal uterus, tubes, and ovaries.  TECHNIQUE:  The patient was taken to the operating room where spinal anesthesia wasinduced and found to be adequate.  She was then placed in the dorsal supine position and prepped and draped in sterile fashion.  After an adequate timeout was performed, attention was turned to the patient's abdomen where a small transverse skin incision was made under the umbilical fold. The incision was taken down to the layer of fascia using the scalpel, and fascia was incised, and extended bilaterally using Mayo scissors. The peritoneum was entered in a sharp fashion. Attention was then turned to the patient's uterus, and left fallopian tube was identified and followed out to the fimbriated end.  A Filshie clip was placed on the left fallopian tube about 2 cm from the cornual attachment, with care given to incorporate the underlying mesosalpinx.  A similar process was carried out on the rightl side allowing for bilateral tubal sterilization.  Good hemostasis was noted overall.  Local analgesia was drizzled on both operative sites.The instruments were then removed from the patient's abdomen  and the fascial incision was repaired with 0 Vicryl, and the skin was closed with a 4-0 Vicryl subcuticular stitch. The patient tolerated the procedure well.  Sponge, lap, and needle counts were correct times two.  The patient was then taken to the recovery room awake, extubated and in stable condition.   Adam PhenixJames G Taylr Meuth, MD 06/16/2014 11:23 AM

## 2014-06-16 NOTE — Anesthesia Procedure Notes (Signed)
Spinal Patient location during procedure: OR Start time: 06/16/2014 10:42 AM Staffing Anesthesiologist: Mal AmabileFOSTER, Naika Noto Performed by: anesthesiologist  Preanesthetic Checklist Completed: patient identified, site marked, surgical consent, pre-op evaluation, timeout performed, IV checked, risks and benefits discussed and monitors and equipment checked Spinal Block Patient position: sitting Prep: site prepped and draped and DuraPrep Patient monitoring: heart rate, cardiac monitor, continuous pulse ox and blood pressure Approach: midline Location: L3-4 Injection technique: single-shot Needle Needle type: Sprotte  Needle gauge: 24 G Needle length: 9 cm Needle insertion depth: 5 cm Assessment Sensory level: T4 Additional Notes Patient tolerated procedure well. Adequate sensory level.

## 2014-06-17 MED ORDER — METOCLOPRAMIDE HCL 10 MG PO TABS
10.0000 mg | ORAL_TABLET | Freq: Four times a day (QID) | ORAL | Status: DC | PRN
  Administered 2014-06-17 – 2014-06-18 (×2): 10 mg via ORAL
  Filled 2014-06-17 (×2): qty 1

## 2014-06-17 NOTE — Progress Notes (Signed)
Patient complaining of onset of severe headache pulsating and sharp in nature that radiates from the bottom of her neck up the back of her head and temple area. She reports taking two Goodie Power pills every 4 to 8 hours during her pregnancy to manage headache pain.

## 2014-06-17 NOTE — Clinical Social Work Maternal (Signed)
  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 [place], [place] [address]  Phone number:  [phone]   Household Members:  Self, Minor Children, Other (Comment) (FOB's father & grandmother)   Natural Supports (not living in the home):  Extended Family, Radiographer, therapeutic Supports: Case Metallurgist ([place] Surgeon)   Employment: Agricultural engineer   Type of Work:     Education:      Pensions consultant:  [employer]   Other Resources:  Physicist, medical , [place] Considerations Which May Impact Care:  None noted  Strengths:  Ability to meet basic needs , Home prepared for child    Risk Factors/Current Problems:  DHHS Involvement  (Previous CPS involvement & substance abuse)   Cognitive State:  Alert , Linear Thinking    Mood/Affect:  Calm , Happy    CSW Assessment: CSW met with pt in her first floor hospital room to assess her current social situation & offer resources as needed.  Pt was pleasant & cooperative when CSW entered the room.  She easily engaged into conversation about her past, regarding mental health diagnoses, substance abuse & previous CPS involvement.  Pt acknowledged mental health diagnoses however told CSW that she was misdiagnosed.  Pt believes she was misdiagnosed because she has not taken medication in at least 5 years & reports that she is able to cope well without it.  She denies any substance use since 2011.  Pt loss custody of 2 of her children, as a  result of her substance use.  She told CSW that she is in the process of trying to regain custody of her daughter, [name] (11/01/10).  According to pt, she attended a custody hearing (via phone) on 06/13/14, since the case is being heard in [place].  Pt states she is hopeful that she will regain custody & seems to think she was a good chance. She acknowledges some depressed moods during the pregnancy & attributes symptoms to lack of finances.  FOB has since then gained employment to relieve some of the financial burden.  She plans to apply to the [facility] in their neighborhood once she is discharged.  CSW offered counseling referrals as a resource however she declined.  PP depression symptoms were discussed & pt agrees to seek medical attention if needed.  She has all the necessary supplies for the infant & appears to be bonding well.  No barriers to discharge.  CSW will assist as needed until discharge.       CSW Plan/Description:  No Further Intervention Required/No Barriers to Discharge    Debby Bud, LCSW 06/17/2014, 11:37 AM

## 2014-06-17 NOTE — Anesthesia Postprocedure Evaluation (Signed)
Anesthesia Post Note  Patient: Lisa Jones  Procedure(s) Performed: Procedure(s) (LRB): POST PARTUM TUBAL LIGATION (N/A)  Anesthesia type: SAB  Patient location: Mother/Baby  Post pain: Pain level controlled  Post assessment: Post-op Vital signs reviewed  Last Vitals:  Filed Vitals:   06/17/14 0727  BP: 142/63  Pulse: 62  Temp: 36.7 C  Resp: 18    Post vital signs: Reviewed  Level of consciousness: awake  Complications: No apparent anesthesia complications

## 2014-06-17 NOTE — Progress Notes (Signed)
Asked to evaluate 4cm x 5cm breast lump.  Breast: 4cmx5cm mobile lesion left breast 4cm superior to nipple at 12 o'clock.  Has had since 2013 a few months after birth/breastfeeding, same size, occasionally sensitive, no skin changes, no relation of symptoms/size to menses.  Will have patient follow up at breast center as soon as possible.  Lisa Jones,Lisa Holtsclaw ROCIO, MD 11:46 PM

## 2014-06-17 NOTE — Addendum Note (Signed)
Addendum  created 06/17/14 [id number] by Jhonnie GarnerBeth M Kenyah Luba, CRNA   Modules edited: Notes Section   Notes Section:  File: [id number]

## 2014-06-17 NOTE — Progress Notes (Addendum)
Post Partum Day 1 Subjective: no complaints, up ad lib, voiding and tolerating PO  Objective: Blood pressure 150/69, pulse 71, temperature 98.4 F (36.9 C), temperature source Oral, resp. rate 16, height 5\' 1"  (1.549 m), weight 123 lb (55.792 kg), last menstrual period 08/30/2013, SpO2 99 %, unknown if currently breastfeeding.  Physical Exam:  General: alert, cooperative and no distress Lochia: appropriate Uterine Fundus: firm Incision: healing well at umbilicus DVT Evaluation: No evidence of DVT seen on physical exam.   Recent Labs  06/16/14 0155  HGB 8.0*  HCT 25.8*    Assessment/Plan: Plan for discharge tomorrow   LOS: 1 day   Lisa Jones 06/17/2014, 12:40 PM

## 2014-06-17 NOTE — Lactation Note (Addendum)
This note was copied from the chart of Lisa Geralyn FlashKatherine Grater. Lactation Consultation Note  Patient Name: Lisa Jones [id number]'XToday's Date: 06/17/2014 Reason for consult: Follow-up assessment  Mom's milk is in.   L breast: Feeding observed on this breast, which softened as baby fed. However, a fixed lump (3-4 cm x 4-5cm?) was noted superior to breast tissue. Mom reports that lump has been there the duration of the pregnancy, but providers attributed it to pregnancy. [facility] made aware.  Baby's top lip doesn't flange. Labial frenum noted, as was a sucking blister on the top lip.   Lisa Jones, Lisa Jones [facility] 06/17/2014, 7:25 PM

## 2014-06-18 MED ORDER — IBUPROFEN 600 MG PO TABS: 600.0000 mg | ORAL_TABLET | Freq: Four times a day (QID) | ORAL | Status: AC

## 2014-06-18 MED ORDER — OXYCODONE-ACETAMINOPHEN 5-325 MG PO TABS: 1.0000 | ORAL_TABLET | Freq: Four times a day (QID) | ORAL | Status: AC | PRN

## 2014-06-18 NOTE — Discharge Summary (Signed)
Obstetric Discharge Summary Reason for Admission: delivery of baby and placenta at home Prenatal Procedures: ultrasound and limited PNC Intrapartum Procedures: spontaneous vaginal delivery Postpartum Procedures: P.P. tubal ligation Complications-Operative and Postpartum: none Delivery Note At 12:34 AM a viable female was delivered at home. Pt arrived via EMS The placenta had also delivered and was abrought in a bag. FF @ u-2, lochia normal. C/O nausea. Baby doing well.  Anesthesia: None  Episiotomy: None Lacerations: None Suture Repair: n/a Est. Blood Loss (mL): <500 cc  Mom to postpartum. Baby to Couplet care / Skin to Skin.   Hospital Course:  Active Problems:   Postpartum care following vaginal delivery   Vaginal delivery   Lisa Jones is a 29 y.o. [id number] s/p SVD.  Patient was admitted after delivering her baby at home.  She had a postpartum course that was uncomplicated including no problems with ambulating, PO intake, urination, pain, or bleeding. The patient feels ready to go home and will be discharged with outpatient follow-up.   Today: No acute events overnight.  Pt denies problems with ambulating, voiding or po intake.  She denies nausea or vomiting.  Pain is well controlled.  She has had flatus. She has not had bowel movement.  Lochia Small.  Plan for birth control is bilateral tubal ligation.  Method of Feeding: Bottle fed breast milk  Physical Exam:  Blood pressure 132/72, pulse 66, temperature 98.1 F (36.7 C), temperature source Oral, resp. rate 20, height 5\' 1"  (1.549 m), weight 123 lb (55.792 kg), last menstrual period 08/30/2013, SpO2 100 %  General: alert, cooperative, appears stated age and no distress Lochia: appropriate Uterine Fundus: firm Incision: healing well, slight clear drainage present DVT Evaluation: No evidence of DVT seen on physical exam. Negative Homan's sign. No cords or calf tenderness. No significant calf/ankle  edema.  H/H: Lab Results  Component Value Date/Time   HGB 8.0* 06/16/2014 01:55 AM   HCT 25.8* 06/16/2014 01:55 AM    Discharge Diagnoses: Term Pregnancy-delivered and Preelampsia  Discharge Information: Date: 06/18/2014 Activity: pelvic rest Diet: routine  Medications: PNV, Ibuprofen and Percocet Breast feeding:  Yes Condition: stable Instructions: refer to handout Discharge to: home      Discharge Instructions    Baby Love Nurse Visit    Complete by:  As directed   Please check BPs at 3 and 7 days after discharge            Medication List    STOP taking these medications        acetaminophen 500 MG tablet  Commonly known as:  TYLENOL      TAKE these medications        ibuprofen 600 MG tablet  Commonly known as:  ADVIL,MOTRIN  Take 1 tablet (600 mg total) by mouth every 6 (six) hours.     oxyCODONE-acetaminophen 5-325 MG per tablet  Commonly known as:  PERCOCET/ROXICET  Take 1-2 tablets by mouth every 6 (six) hours as needed for moderate pain or severe pain.      ASK your doctor about these medications        prenatal multivitamin Tabs tablet  Take 1 tablet by mouth daily.       Follow-up Information    [facility] [place] [facility] weeks.   Why:  postpartum follow up   Contact information:   [address] [place] [id number] [phone]      Schedule an appointment as soon as possible for  a visit with [facility].   Why:  Breast lump check   Contact information:   [address] [place] [address]       Raliegh Ip, DO [facility], PGY-1 06/18/2014,7:47 AM  Marlynn Perking CNM

## 2014-06-18 NOTE — Lactation Note (Signed)
This note was copied from the chart of Boy Geralyn FlashKatherine Butson. Lactation Consultation Note: Breasts are much fuller today. Reports baby just nursed on right breast. Latched baby to left breast and he nursed for 5 min then off to sleep. Mom pumping as I left room. #27 flange given- mom reports that feels better. Reviewed engorgement prevention and treatment. No questions at present. To call prn Patient Name: Boy Geralyn FlashKatherine Hlavaty ZOXWR'UToday's Date: 06/18/2014 Reason for consult: Follow-up assessment   Maternal Data Formula Feeding for Exclusion: No Has patient been taught Hand Expression?: Yes Does the patient have breastfeeding experience prior to this delivery?: Yes  Feeding Feeding Type: Breast Fed Nipple Type: Regular Length of feed: 5 min  LATCH Score/Interventions Latch: Grasps breast easily, tongue down, lips flanged, rhythmical sucking.  Audible Swallowing: A few with stimulation  Type of Nipple: Everted at rest and after stimulation  Comfort (Breast/Nipple): Soft / non-tender     Hold (Positioning): No assistance needed to correctly position infant at breast.  LATCH Score: 9  Lactation Tools Discussed/Used     Consult Status Consult Status: Complete    Pamelia HoitWeeks, Terrian Ridlon D 06/18/2014, 10:04 AM

## 2014-06-18 NOTE — Discharge Instructions (Signed)
Postpartum Tubal Ligation °Care After °Refer to this sheet in the next few weeks. These instructions provide you with information on caring for yourself after your procedure. Your caregiver may also give you more specific instructions. Your treatment has been planned according to current medical practices, but problems sometimes occur. Call your caregiver if you have any problems or questions after your procedure. °HOME CARE INSTRUCTIONS  °· Rest the remainder of the day. °· Only take over-the-counter or prescription medicines for pain, discomfort, or fever as directed by your caregiver. Do not take aspirin. It can cause bleeding. °· Gradually resume daily activities, diet, rest, driving, and work. °· Avoid sexual intercourse for 2 weeks or as directed. °· Do not drive while taking pain medicine. °· Do not lift anything over 5 pounds for 2 weeks or as directed. °· Only take showers, not baths, until you are seen by your caregiver. °· Change bandages (dressings) as directed. °· Take your temperature twice a day and record it. °· Try to have help for the first 7-10 days for your household needs. °· Return to your caregiver to get your stitches (sutures) removed and for follow-up visits as directed. °SEEK MEDICAL CARE IF:  °· You have redness, swelling, or increasing pain in the wound. °· You have drainage from the wound lasting longer than 1 day. °· Your pain is getting worse. °· You have a rash. °· You become dizzy or lightheaded. °· You have a reaction to your medicine. °· You need stronger medicine or a change in your pain medicine. °· You notice a bad smell coming from the wound or dressing. °· Your wound breaks open after the sutures have been removed. °· You are constipated. °SEEK IMMEDIATE MEDICAL CARE IF:  °· You faint. °· You have a fever. °· You have increasing abdominal pain. °· You have severe pain in your shoulders. °· You have bleeding or drainage from the suture sites or vagina following surgery. °· You  have shortness of breath or difficulty breathing. °· You have chest or leg pain. °· You have persistent nausea, vomiting, or diarrhea. °MAKE SURE YOU:  °· Understand these instructions. °· Watch your condition. °· Get help right away if you are not doing well or get worse. °Document Released: 08/05/2011 Document Reviewed: 08/05/2011 °ExitCare® Patient Information ©2015 [employer]. This information is not intended to replace advice given to you by your health care provider. Make sure you discuss any questions you have with your health care provider. ° °Postpartum Care After Vaginal Delivery °After you deliver your newborn (postpartum period), the usual stay in the hospital is 24-72 hours. If there were problems with your labor or delivery, or if you have other medical problems, you might be in the hospital longer.  °While you are in the hospital, you will receive help and instructions on how to care for yourself and your newborn during the postpartum period.  °While you are in the hospital: °· Be sure to tell your nurses if you have pain or discomfort, as well as where you feel the pain and what makes the pain worse. °· If you had an incision made near your vagina (episiotomy) or if you had some tearing during delivery, the nurses may put ice packs on your episiotomy or tear. The ice packs may help to reduce the pain and swelling. °· If you are breastfeeding, you may feel uncomfortable contractions of your uterus for a couple of weeks. This is normal. The contractions help your uterus get   back to normal size. °· It is normal to have some bleeding after delivery. °¨ For the first 1-3 days after delivery, the flow is red and the amount may be similar to a period. °¨ It is common for the flow to start and stop. °¨ In the first few days, you may pass some small clots. Let your nurses know if you begin to pass large clots or your flow increases. °¨ Do not  flush blood clots down the toilet before having the nurse look  at them. °¨ During the next 3-10 days after delivery, your flow should become more watery and pink or brown-tinged in color. °¨ Ten to fourteen days after delivery, your flow should be a small amount of yellowish-white discharge. °¨ The amount of your flow will decrease over the first few weeks after delivery. Your flow may stop in 6-8 weeks. Most women have had their flow stop by 12 weeks after delivery. °· You should change your sanitary pads frequently. °· Wash your hands thoroughly with soap and water for at least 20 seconds after changing pads, using the toilet, or before holding or feeding your newborn. °· You should feel like you need to empty your bladder within the first 6-8 hours after delivery. °· In case you become weak, lightheaded, or faint, call your nurse before you get out of bed for the first time and before you take a shower for the first time. °· Within the first few days after delivery, your breasts may begin to feel tender and full. This is called engorgement. Breast tenderness usually goes away within 48-72 hours after engorgement occurs. You may also notice milk leaking from your breasts. If you are not breastfeeding, do not stimulate your breasts. Breast stimulation can make your breasts produce more milk. °· Spending as much time as possible with your newborn is very important. During this time, you and your newborn can feel close and get to know each other. Having your newborn stay in your room (rooming in) will help to strengthen the bond with your newborn.  It will give you time to get to know your newborn and become comfortable caring for your newborn. °· Your hormones change after delivery. Sometimes the hormone changes can temporarily cause you to feel sad or tearful. These feelings should not last more than a few days. If these feelings last longer than that, you should talk to your caregiver. °· If desired, talk to your caregiver about methods of family planning or  contraception. °· Talk to your caregiver about immunizations. Your caregiver may want you to have the following immunizations before leaving the hospital: °¨ Tetanus, diphtheria, and pertussis (Tdap) or tetanus and diphtheria (Td) immunization. It is very important that you and your family (including grandparents) or others caring for your newborn are up-to-date with the Tdap or Td immunizations. The Tdap or Td immunization can help protect your newborn from getting ill. °¨ Rubella immunization. °¨ Varicella (chickenpox) immunization. °¨ Influenza immunization. You should receive this annual immunization if you did not receive the immunization during your pregnancy. °Document Released: 12/01/2006 Document Revised: 10/29/2011 Document Reviewed: 10/01/2011 °ExitCare® Patient Information ©2015 [employer]. This information is not intended to replace advice given to you by your health care provider. Make sure you discuss any questions you have with your health care provider. ° ° °

## 2014-06-19 ENCOUNTER — Encounter (HOSPITAL_COMMUNITY): Payer: Self-pay | Admitting: Obstetrics & Gynecology

## 2014-06-19 ENCOUNTER — Telehealth: Payer: Self-pay | Admitting: *Deleted

## 2014-06-19 DIAGNOSIS — N649 Disorder of breast, unspecified: Secondary | ICD-10-CM

## 2014-06-19 NOTE — Telephone Encounter (Signed)
[facility] appointment on Friday, Jun 23, 2014 @0930 .  Contacted patient, appointment date/time given

## 2014-06-19 NOTE — Progress Notes (Signed)
Ur chart review completed.  

## 2014-06-20 ENCOUNTER — Encounter: Payer: Self-pay | Admitting: Obstetrics & Gynecology

## 2014-06-23 ENCOUNTER — Inpatient Hospital Stay: Admission: RE | Admit: 2014-06-23 | Payer: Medicaid Other | Source: Ambulatory Visit

## 2014-06-29 ENCOUNTER — Encounter: Payer: Self-pay | Admitting: *Deleted

## 2014-07-31 ENCOUNTER — Ambulatory Visit: Payer: Medicaid Other | Admitting: Obstetrics & Gynecology

## 2014-08-11 ENCOUNTER — Ambulatory Visit: Payer: Medicaid Other | Admitting: Family Medicine

## 2017-01-04 ENCOUNTER — Emergency Department (HOSPITAL_COMMUNITY): Payer: Medicaid Other

## 2017-01-04 ENCOUNTER — Encounter (HOSPITAL_COMMUNITY): Payer: Self-pay

## 2017-01-04 ENCOUNTER — Emergency Department (HOSPITAL_COMMUNITY)
Admission: EM | Admit: 2017-01-04 | Discharge: 2017-01-04 | Disposition: A | Discharge status: Home / Self Care | Payer: Medicaid Other | Attending: Emergency Medicine | Admitting: Emergency Medicine

## 2017-01-04 DIAGNOSIS — T192XXA Foreign body in vulva and vagina, initial encounter: Secondary | ICD-10-CM | POA: Diagnosis not present

## 2017-01-04 DIAGNOSIS — Y929 Unspecified place or not applicable: Secondary | ICD-10-CM | POA: Insufficient documentation

## 2017-01-04 DIAGNOSIS — F17210 Nicotine dependence, cigarettes, uncomplicated: Secondary | ICD-10-CM | POA: Insufficient documentation

## 2017-01-04 DIAGNOSIS — B9689 Other specified bacterial agents as the cause of diseases classified elsewhere: Secondary | ICD-10-CM | POA: Insufficient documentation

## 2017-01-04 DIAGNOSIS — Y999 Unspecified external cause status: Secondary | ICD-10-CM | POA: Diagnosis not present

## 2017-01-04 DIAGNOSIS — N760 Acute vaginitis: Secondary | ICD-10-CM | POA: Diagnosis not present

## 2017-01-04 DIAGNOSIS — X58XXXA Exposure to other specified factors, initial encounter: Secondary | ICD-10-CM | POA: Diagnosis not present

## 2017-01-04 DIAGNOSIS — Y939 Activity, unspecified: Secondary | ICD-10-CM | POA: Diagnosis not present

## 2017-01-04 DIAGNOSIS — R102 Pelvic and perineal pain: Secondary | ICD-10-CM

## 2017-01-04 DIAGNOSIS — R1032 Left lower quadrant pain: Secondary | ICD-10-CM | POA: Diagnosis present

## 2017-01-04 LAB — URINALYSIS, ROUTINE W REFLEX MICROSCOPIC
BILIRUBIN URINE: NEGATIVE
Glucose, UA: NEGATIVE mg/dL
Hgb urine dipstick: NEGATIVE
Ketones, ur: 5 mg/dL — AB
LEUKOCYTES UA: NEGATIVE
Nitrite: POSITIVE — AB
PH: 5 (ref 5.0–8.0)
Protein, ur: NEGATIVE mg/dL
Specific Gravity, Urine: 1.014 (ref 1.005–1.030)

## 2017-01-04 LAB — COMPREHENSIVE METABOLIC PANEL
ALT: 20 U/L (ref 14–54)
ANION GAP: 7 (ref 5–15)
AST: 20 U/L (ref 15–41)
Albumin: 4.4 g/dL (ref 3.5–5.0)
Alkaline Phosphatase: 38 U/L (ref 38–126)
BUN: 13 mg/dL (ref 6–20)
CHLORIDE: 108 mmol/L (ref 101–111)
CO2: 24 mmol/L (ref 22–32)
Calcium: 9 mg/dL (ref 8.9–10.3)
Creatinine, Ser: 0.71 mg/dL (ref 0.44–1.00)
GFR calc Af Amer: >60 mL/min (ref >60)
GFR calc non Af Amer: >60 mL/min (ref >60)
GLUCOSE: 96 mg/dL (ref 65–99)
POTASSIUM: 4.2 mmol/L (ref 3.5–5.1)
SODIUM: 139 mmol/L (ref 135–145)
Total Bilirubin: 0.5 mg/dL (ref 0.3–1.2)
Total Protein: 7 g/dL (ref 6.5–8.1)

## 2017-01-04 LAB — CBC
HEMATOCRIT: 39.4 % (ref 36.0–46.0)
HEMOGLOBIN: 13.2 g/dL (ref 12.0–15.0)
MCH: 31.6 pg (ref 26.0–34.0)
MCHC: 33.5 g/dL (ref 30.0–36.0)
MCV: 94.3 fL (ref 78.0–100.0)
Platelets: 260 10*3/uL (ref 150–400)
RBC: 4.18 MIL/uL (ref 3.87–5.11)
RDW: 12.8 % (ref 11.5–15.5)
WBC: 9.8 10*3/uL (ref 4.0–10.5)

## 2017-01-04 LAB — WET PREP, GENITAL
Sperm: NONE SEEN
TRICH WET PREP: NONE SEEN
Yeast Wet Prep HPF POC: NONE SEEN

## 2017-01-04 LAB — I-STAT BETA HCG BLOOD, ED (MC, WL, AP ONLY): I-stat hCG, quantitative: <5 m[IU]/mL (ref <5)

## 2017-01-04 LAB — LIPASE, BLOOD: LIPASE: 27 U/L (ref 11–51)

## 2017-01-04 MED ORDER — DOXYCYCLINE HYCLATE 100 MG PO CAPS: 100.0000 mg | ORAL_CAPSULE | Freq: Two times a day (BID) | ORAL | 0 refills | Status: AC

## 2017-01-04 MED ORDER — ONDANSETRON 4 MG PO TBDP
4.0000 mg | ORAL_TABLET | Freq: Once | ORAL | Status: AC
  Administered 2017-01-04: 4 mg via ORAL
  Filled 2017-01-04: qty 1

## 2017-01-04 MED ORDER — KETOROLAC TROMETHAMINE 30 MG/ML IJ SOLN
30.0000 mg | Freq: Once | INTRAMUSCULAR | Status: AC
  Administered 2017-01-04: 30 mg via INTRAVENOUS
  Filled 2017-01-04: qty 1

## 2017-01-04 MED ORDER — LIDOCAINE HCL (PF) 2 % IJ SOLN
INTRAMUSCULAR | Status: AC
  Administered 2017-01-04: 21:00:00
  Filled 2017-01-04: qty 10

## 2017-01-04 MED ORDER — METRONIDAZOLE 500 MG PO TABS: 500.0000 mg | ORAL_TABLET | Freq: Two times a day (BID) | ORAL | 0 refills | Status: AC

## 2017-01-04 MED ORDER — CEFTRIAXONE SODIUM 250 MG IJ SOLR
250.0000 mg | Freq: Once | INTRAMUSCULAR | Status: AC
  Administered 2017-01-04: 250 mg via INTRAMUSCULAR
  Filled 2017-01-04: qty 250

## 2017-01-04 NOTE — Discharge Instructions (Addendum)
Pain should improve now that the foreign body has been removed.  Please complete course of both antibiotics as directed, make sure you take full course even if you are feeling better.  Do not drink alcohol while taking these antibiotics. You may use Tylenol or ibuprofen for pain.  Schedule an appointment for follow-up either with the health department or the women's health faculty practice at the [facility] for a routine pelvic exam and Pap smear.  If pain worsens or you develop other new or concerning symptoms please return to the ED sooner for reevaluation.

## 2017-01-04 NOTE — ED Provider Notes (Signed)
[facility]-EMERGENCY DEPT Provider Note   CSN: [id number] Arrival date & time: 01/04/17  1216     History   Chief Complaint Chief Complaint  Patient presents with  . Flank Pain    L  . Abdominal Pain  . Vaginal Discharge    HPI  Lisa Jones is a 31 y.o. Female with a history of bipolar disorder, previous tubal ligation, who presents today complaining of left flank pain that extends down into the groin and is worse in the suprapubic region.  Patient reports the symptoms have been present for the last 2-3 days.  She has tried aspirin and ibuprofen, with no improvement, has not tried any other medications. Patient denies associated dysuria or urinary frequency.  Patient does report some thick white vaginal discharge for the past few days, as well as some vaginal bleeding last night and today, patient is unable to describe how much bleeding has been present.  Reports last menstrual period was 2 weeks ago and this bleeding is atypical.  She reports she has had unprotected sex in the past 2 weeks since her last menstrual period.  Reports she had similar symptoms once before several years ago, was diagnosed with an ovarian cyst.  Patient denies any associated nausea or vomiting, no fevers or chills.  She reports she has had decreased appetite with this.      Past Medical History:  Diagnosis Date  . Bipolar 1 disorder (HCC)   . Manic depression (HCC)   . Poor dentition   . Suicidal ideation     Patient Active Problem List   Diagnosis Date Noted  . Vaginal delivery 06/17/2014  . Postpartum care following vaginal delivery   . Small-for-dates fetus, third trimester   . [redacted] weeks gestation of pregnancy   . Evaluate anatomy not seen on prior sonogram   . Uterine size date discrepancy pregnancy   . Tobacco smoking complicating pregnancy in third trimester   . Prior pregnancy complicated by IUGR, antepartum   . Suspected problem with fetal growth not found   .  Tobacco smoking affecting pregnancy   . [redacted] weeks gestation of pregnancy   . Prior poor obstetrical history in third trimester, antepartum   . Encounter for fetal anatomic survey   . Late prenatal care affecting pregnancy in second trimester, antepartum   . [redacted] weeks gestation of pregnancy   . UTI (urinary tract infection) during pregnancy 10/30/2011  . Insufficient prenatal care 09/27/2011    Past Surgical History:  Procedure Laterality Date  . MANDIBLE FRACTURE SURGERY    . POST PARTUM TUBAL LIGATION N/A 06/16/2014   Performed by Adam Phenix, MD at [facility]    OB History    Gravida Para Term Preterm AB Living   8 4 4   4 4    SAB TAB Ectopic Multiple Live Births   4     0 4       Home Medications    Prior to Admission medications   Medication Sig Start Date End Date Taking? Authorizing Provider  acetaminophen (TYLENOL) 500 MG tablet Take 1,500-2,000 mg daily by mouth.    Yes [provider]  Aspirin-Acetaminophen-Caffeine (GOODY HEADACHE PO) Take 10-12 packets daily by mouth.   Yes [provider]  ibuprofen (ADVIL,MOTRIN) 200 MG tablet Take 600-800 mg 3 (three) times daily by mouth.    Yes [provider]  ibuprofen (ADVIL,MOTRIN) 600 MG tablet Take 1 tablet (600 mg total) by mouth every 6 (  six) hours. Patient not taking: Reported on 01/04/2017 06/18/14   Raliegh IpGottschalk, Ashly M, DO  oxyCODONE-acetaminophen (PERCOCET/ROXICET) 5-325 MG per tablet Take 1-2 tablets by mouth every 6 (six) hours as needed for moderate pain or severe pain. Patient not taking: Reported on 01/04/2017 06/18/14   Raliegh IpGottschalk, Ashly M, DO  Prenatal Vit-Fe Fumarate-FA (PRENATAL MULTIVITAMIN) TABS Take 1 tablet by mouth daily.    [provider]    Family History Family History  Problem Relation Age of Onset  . Other Neg Hx   . Heart disease Father     Social History Social History   Tobacco Use  . Smoking status: Current Every Day Smoker    Packs/day: 0.50     Types: Cigarettes  . Smokeless tobacco: Never Used  Substance Use Topics  . Alcohol use: No  . Drug use: No     Allergies   Codeine   Review of Systems Review of Systems  Constitutional: Negative for chills and fever.  HENT: Negative for congestion, rhinorrhea and sore throat.   Eyes: Negative for visual disturbance.  Respiratory: Negative for cough, chest tightness and shortness of breath.   Cardiovascular: Negative for chest pain and palpitations.  Gastrointestinal: Positive for abdominal pain. Negative for constipation, diarrhea, nausea and vomiting.  Genitourinary: Positive for pelvic pain, vaginal bleeding, vaginal discharge and vaginal pain. Negative for dysuria and hematuria.  Musculoskeletal: Negative for arthralgias and myalgias.  Skin: Negative for pallor and rash.  Neurological: Negative for dizziness, weakness, light-headedness and numbness.     Physical Exam Updated Vital Signs BP 111/69   Pulse 65   Temp 98.3 F (36.8 C) (Oral)   Resp 20   Ht 5\' 1"  (1.549 m)   Wt 52.2 kg (115 lb)   SpO2 98%   BMI 21.73 kg/m   Physical Exam  Constitutional: She appears well-developed and well-nourished. No distress.  Patient appears uncomfortable on exam, having a hard time remaining still  HENT:  Head: Normocephalic and atraumatic.  Eyes: Right eye exhibits no discharge. Left eye exhibits no discharge.  Cardiovascular: Normal rate, regular rhythm and normal heart sounds.  Pulmonary/Chest: Effort normal. No respiratory distress.  Abdominal: Soft. Normal appearance and bowel sounds are normal. She exhibits no distension and no mass. There is tenderness in the right upper quadrant, suprapubic area, left upper quadrant and left lower quadrant. There is guarding. There is no tenderness at McBurney's point and negative Murphy's sign.  Abdomen with some generalized tenderness on exam, worse on the left side, primarily in the left lower quadrant and suprapubic region, with  minimal guarding, negative Murphy sign, no tenderness at McBurney's point, no rebound tenderness, no peritoneal signs  Genitourinary: Left adnexum displays tenderness.  There is a foreign body (Retained tampon) in the vagina.  Genitourinary Comments: On exam there is a retained tampon present in the vaginal vault, with some surrounding whitish discharge, the foreign body was removed and the cervix was visualized, with a very small amount of bleeding, there are some small cysts present on the cervix, there is some tenderness with cervical motion, and on palpation of the left adnexa, no tenderness on the right  Neurological: She is alert. Coordination normal.  Skin: Skin is warm and dry. Capillary refill takes less than 2 seconds. She is not diaphoretic.  Psychiatric: She has a normal mood and affect. Her behavior is normal.  Nursing note and vitals reviewed.    ED Treatments / Results  Labs (all labs ordered are listed, but only  abnormal results are displayed) Labs Reviewed  WET PREP, GENITAL - Abnormal; Notable for the following components:      Result Value   Clue Cells Wet Prep HPF POC PRESENT (*)    WBC, Wet Prep HPF POC FEW (*)    All other components within normal limits  URINALYSIS, ROUTINE W REFLEX MICROSCOPIC - Abnormal; Notable for the following components:   APPearance HAZY (*)    Ketones, ur 5 (*)    Nitrite POSITIVE (*)    Bacteria, UA MANY (*)    Squamous Epithelial / LPF 6-30 (*)    All other components within normal limits  LIPASE, BLOOD  COMPREHENSIVE METABOLIC PANEL  CBC  I-STAT BETA HCG BLOOD, ED ([facility], [facility], [facility] ONLY)  GC/CHLAMYDIA PROBE AMP ([facility]) NOT AT [facility]    EKG  EKG Interpretation None       Radiology US Pelvic Complete With Transvaginal  Result Date: 01/04/2017 CLINICAL DATA:  Left flank pain. EXAM: TRANSABDOMINAL AND TRANSVAGINAL ULTRASOUND OF PELVIS TECHNIQUE: Both transabdominal and transvaginal ultrasound examinations of the pelvis were  performed. Transabdominal technique was performed for global imaging of the pelvis including uterus, ovaries, adnexal regions, and pelvic cul-de-sac. It was necessary to proceed with endovaginal exam following the transabdominal exam to visualize the ovaries and endometrium. COMPARISON:  None FINDINGS: Uterus Measurements: 8.3 x 4.0 x 5.3 cm. No fibroids or other mass visualized. Endometrium Thickness: 11.3 mm.  No focal abnormality visualized. Right ovary Measurements: 4.5 x 3.1 x 3.7 cm. Normal appearance/no adnexal mass. Left ovary Measurements: 3.6 x 2.3 x 2.8 cm. Normal appearance/no adnexal mass. Other findings A small amount of fluid in the pelvis may be physiologic. IMPRESSION: 1. The small amount of fluid in the pelvis may be physiologic. The uterus, endometrium, and ovaries are normal in appearance. Electronically Signed   By: Gerome Sam III M.D   On: 01/04/2017 19:26    Procedures Procedures (including critical care time)  Medications Ordered in ED Medications  ketorolac (TORADOL) 30 MG/ML injection 30 mg (30 mg Intravenous Given 01/04/17 1642)  ondansetron (ZOFRAN-ODT) disintegrating tablet 4 mg (4 mg Oral Given 01/04/17 1642)  cefTRIAXone (ROCEPHIN) injection 250 mg (250 mg Intramuscular Given 01/04/17 2101)  lidocaine (XYLOCAINE) 2 % injection (  Given 01/04/17 2112)     Initial Impression / Assessment and Plan / ED Course  I have reviewed the triage vital signs and the nursing notes.  Pertinent labs & imaging results that were available during my care of the patient were reviewed by me and considered in my medical decision making (see chart for details).  Patient presents complaining of left-sided abdominal pain that radiates down into the groin and pelvis, with associated vaginal bleeding and discharge for the past 2-3 days.  Patient appears uncomfortable, but is overall well-appearing with normal vitals. On exam patient does have some tenderness over the left abdomen with  minimal guarding. On pelvic exam there is a retained tampon in the vaginal vault, with some discharge and minimal bleeding, bimanual exam does show some tenderness worse over the left adnexa than the right. Obtain wet prep and GC/Chlamydia cultures. Given tenderness on pelvic exam Will obtain pelvic ultrasound.  Laboratory evaluation of abdominal pain is reassuring, no leukocytosis, hemoglobin is normal, no electrolyte derangements, kidney and liver function are normal, negative hCG and normal lipase, urinalysis just shows some positive nitrates, but also appears to be contaminated. Wet prep shows white blood cells as well as some clue cells. GC/Chlamydia cultures pending.  Pelvic ultrasound shows a small amount of free fluid, which may be physiologic, ovaries and uterus appear normal, with no evidence of tubo-ovarian abscess.   Patient's pain is much improved after Toradol and Zofran, tenderness is almost completely resolved on abdominal exam. Given retained vaginal foreign body for unknown amount of time, will treat patient with antibiotics to cover for PID, will also treat for BV, patient given ceftriaxone shot in the ED, discharged with doxycycline and Flagyl. Counseled patient on sustaining from alcohol while taking these antibiotics. Discussed with patient that she will need to follow up with the health department or women's health OB/GYN clinic, for routine pelvic exam and Pap smear. Return precautions discussed that should warrant sooner reevaluation. Patient expresses understanding and is in agreement with plan.  Final Clinical Impressions(s) / ED Diagnoses   Final diagnoses:  Pelvic pain  BV (bacterial vaginosis)  Foreign body in vagina, initial encounter    ED Discharge Orders        Ordered    doxycycline (VIBRAMYCIN) 100 MG capsule  2 times daily     01/04/17 2018    metroNIDAZOLE (FLAGYL) 500 MG tablet  2 times daily     01/04/17 2018       Dartha LodgeFord, Greyson Peavy N, [place]PA-C 01/05/17 1329      Tegeler, Canary Brimhristopher J, MD 01/05/17 [phone]

## 2017-01-04 NOTE — ED Triage Notes (Signed)
Pt reports L sided flank and abdominal pain. She states that she has a history of "spasms" in that area. A&Ox4. Pt not able to sit still d/t pain. Denies urinary symptoms.

## 2017-01-05 LAB — GC/CHLAMYDIA PROBE AMP (~~LOC~~) NOT AT ARMC
CHLAMYDIA, DNA PROBE: NEGATIVE
Neisseria Gonorrhea: NEGATIVE

## 2019-09-04 IMAGING — US US PELVIS COMPLETE TRANSABD/TRANSVAG
1 series · 14 of 25 positions shown · non-contrast
Comparison: None

CLINICAL DATA: Left flank pain.

EXAM:
TRANSABDOMINAL AND TRANSVAGINAL ULTRASOUND OF PELVIS
TECHNIQUE: Both transabdominal and transvaginal ultrasound examinations of the
pelvis were performed. Transabdominal technique was performed for
global imaging of the pelvis including uterus, ovaries, adnexal
regions, and pelvic cul-de-sac. It was necessary to proceed with
endovaginal exam following the transabdominal exam to visualize the
ovaries and endometrium.

[Series 1: us pelvis complete transabd/transvag · 0.12mm/px · 14 of 41 slices shown]
[im 1/41]
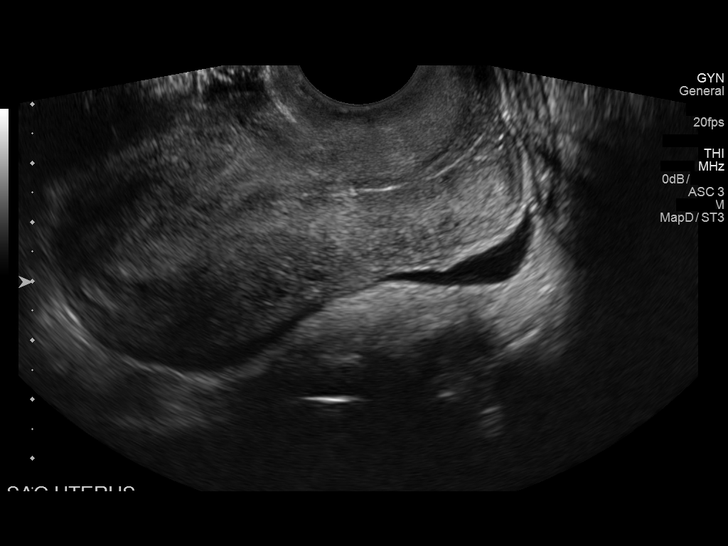
[im 4/41]
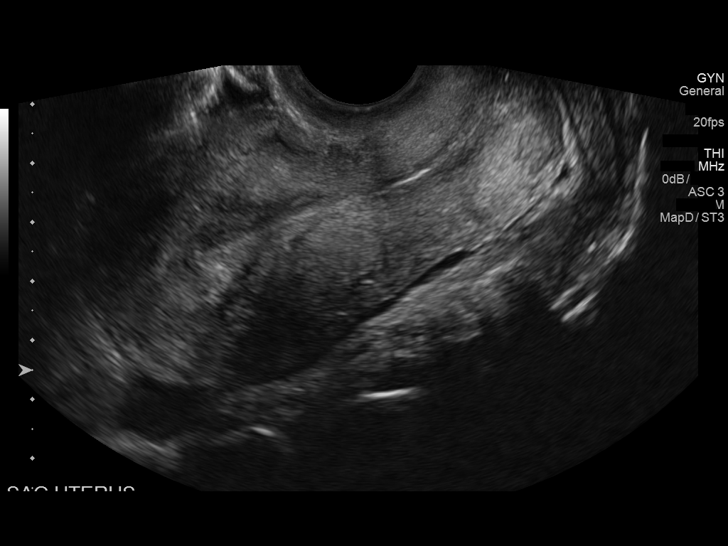
[im 7/41]
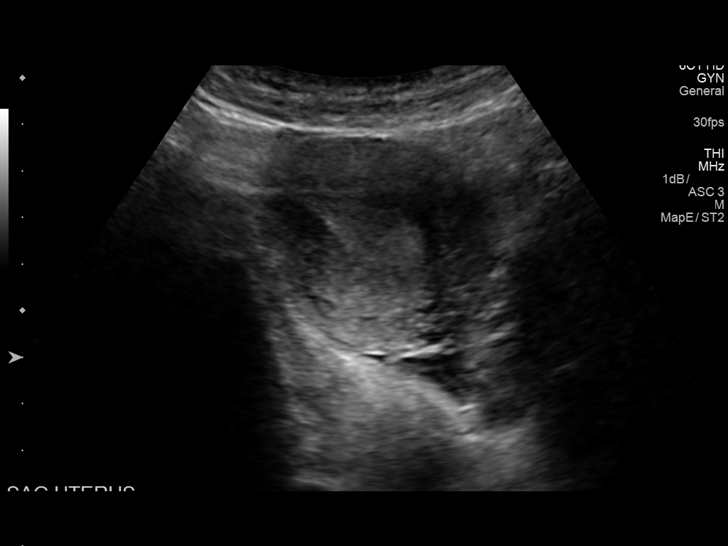
[im 11/41]
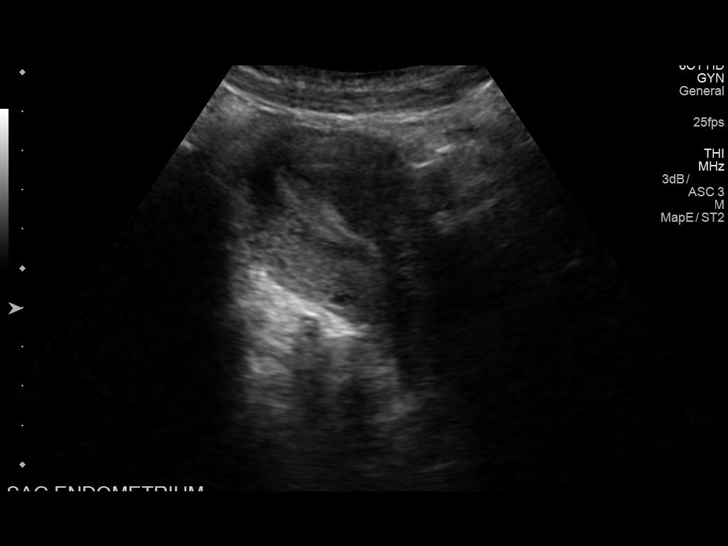
[im 14/41]
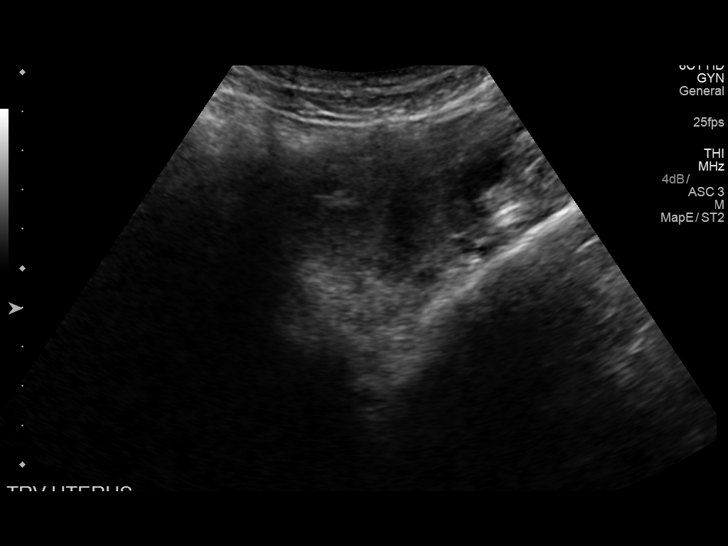
[im 16/41]
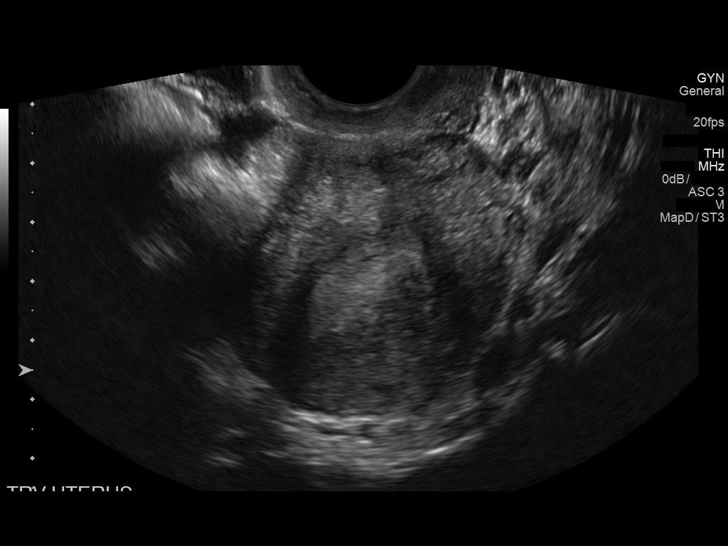
[im 19/41]
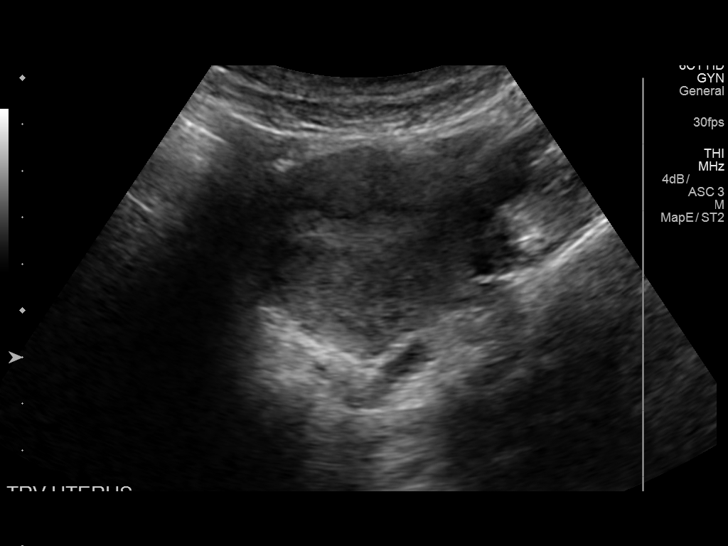
[im 22/41]
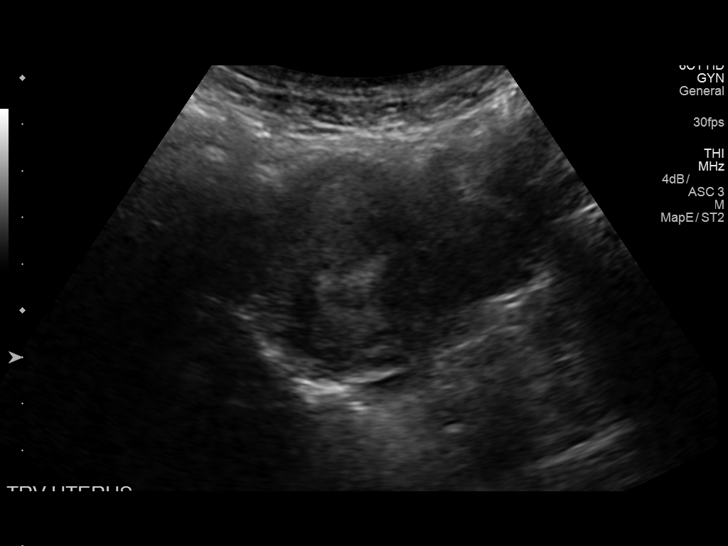
[im 26/41]
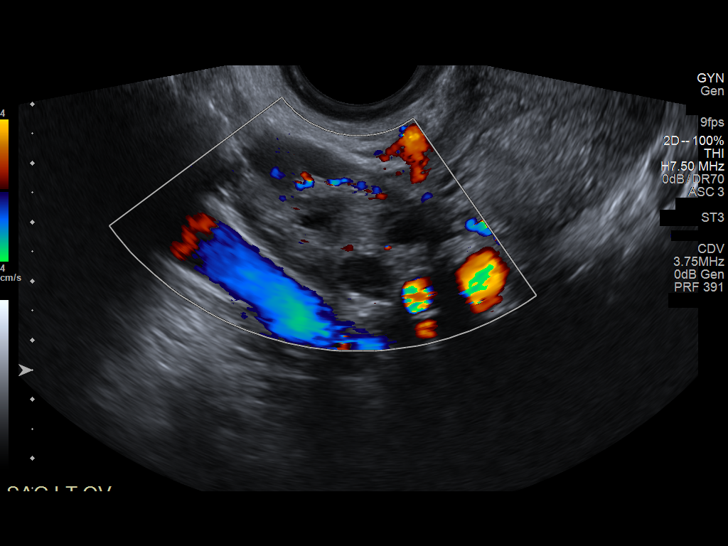
[im 27/41]
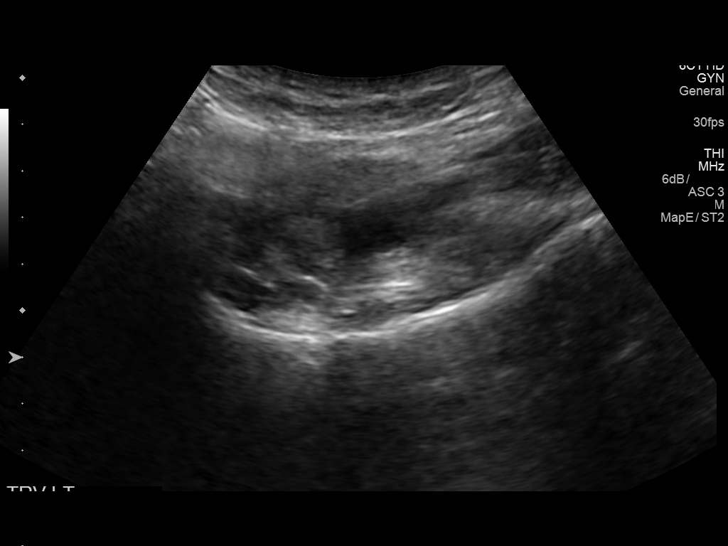
[im 31/41]
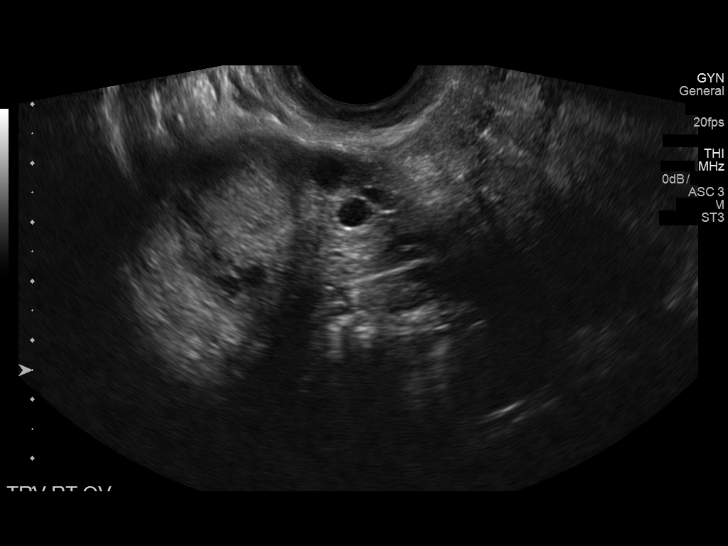
[im 34/41]
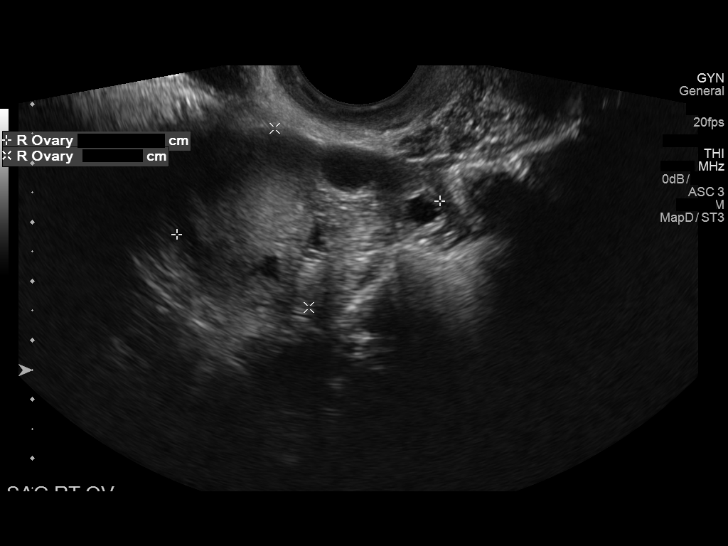
[im 37/41]
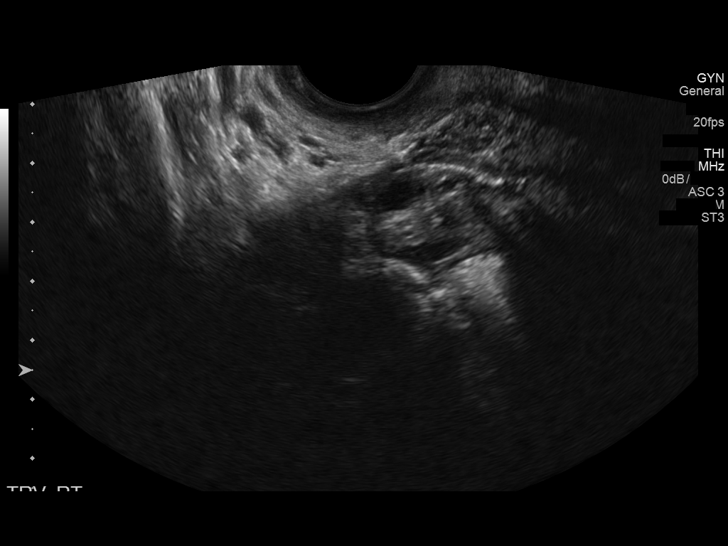
[im 41/41]
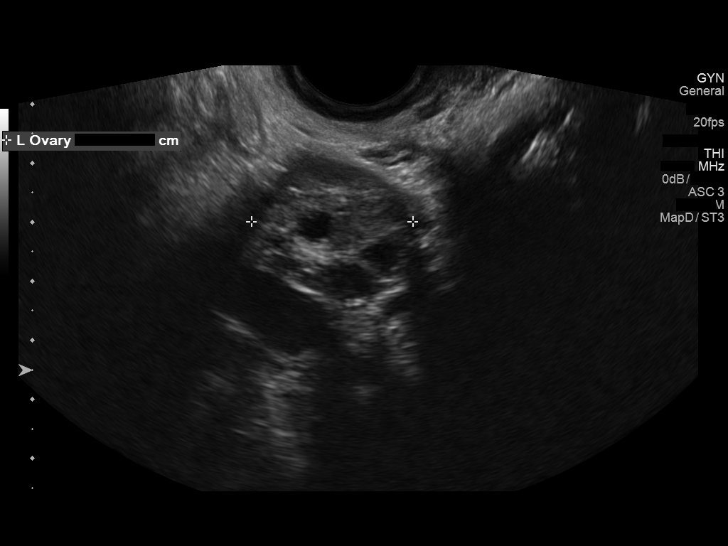

[14 of 25 positions shown; findings below may reference images not displayed]

FINDINGS: Uterus

Measurements: 8.3 x 4.0 x 5.3 cm. No fibroids or other mass
visualized.

Endometrium

Thickness: 11.3 mm.  No focal abnormality visualized.

Right ovary

Measurements: 4.5 x 3.1 x 3.7 cm. Normal appearance/no adnexal mass.

Left ovary

Measurements: 3.6 x 2.3 x 2.8 cm. Normal appearance/no adnexal mass.

Other findings

A small amount of fluid in the pelvis may be physiologic.
IMPRESSION: 1. The small amount of fluid in the pelvis may be physiologic. The
uterus, endometrium, and ovaries are normal in appearance.

## 2022-06-18 DEATH — deceased
